# Patient Record
Sex: Female | Born: 1980 | Race: White | Hispanic: Yes | Marital: Single | Smoking: Never smoker
Health system: Southern US, Community
[De-identification: ages and names within clinical notes are randomized; demographics above are authoritative.]

## PROBLEM LIST (undated history)

## (undated) ENCOUNTER — Inpatient Hospital Stay (HOSPITAL_COMMUNITY): Payer: Self-pay

## (undated) DIAGNOSIS — N83209 Unspecified ovarian cyst, unspecified side: Secondary | ICD-10-CM

## (undated) DIAGNOSIS — R112 Nausea with vomiting, unspecified: Secondary | ICD-10-CM

## (undated) DIAGNOSIS — Z9889 Other specified postprocedural states: Secondary | ICD-10-CM

---
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
## 2009-11-11 ENCOUNTER — Emergency Department (HOSPITAL_COMMUNITY): Admission: EM | Admit: 2009-11-11 | Discharge: 2009-11-11 | Payer: Self-pay | Admitting: Emergency Medicine

## 2016-03-22 ENCOUNTER — Inpatient Hospital Stay (HOSPITAL_COMMUNITY)
Admission: AD | Admit: 2016-03-22 | Discharge: 2016-03-22 | Disposition: A | Discharge status: Home / Self Care | Payer: Self-pay | Source: Ambulatory Visit | Attending: Obstetrics and Gynecology | Admitting: Obstetrics and Gynecology

## 2016-03-22 ENCOUNTER — Inpatient Hospital Stay (HOSPITAL_COMMUNITY): Payer: Self-pay

## 2016-03-22 ENCOUNTER — Encounter (HOSPITAL_COMMUNITY): Payer: Self-pay | Admitting: Obstetrics and Gynecology

## 2016-03-22 DIAGNOSIS — N83201 Unspecified ovarian cyst, right side: Secondary | ICD-10-CM

## 2016-03-22 DIAGNOSIS — Z9889 Other specified postprocedural states: Secondary | ICD-10-CM | POA: Insufficient documentation

## 2016-03-22 DIAGNOSIS — O209 Hemorrhage in early pregnancy, unspecified: Secondary | ICD-10-CM

## 2016-03-22 DIAGNOSIS — O26851 Spotting complicating pregnancy, first trimester: Secondary | ICD-10-CM | POA: Insufficient documentation

## 2016-03-22 DIAGNOSIS — Z3A11 11 weeks gestation of pregnancy: Secondary | ICD-10-CM | POA: Insufficient documentation

## 2016-03-22 DIAGNOSIS — O3680X0 Pregnancy with inconclusive fetal viability, not applicable or unspecified: Secondary | ICD-10-CM

## 2016-03-22 DIAGNOSIS — O26891 Other specified pregnancy related conditions, first trimester: Secondary | ICD-10-CM | POA: Insufficient documentation

## 2016-03-22 DIAGNOSIS — R1032 Left lower quadrant pain: Secondary | ICD-10-CM | POA: Insufficient documentation

## 2016-03-22 DIAGNOSIS — Z3201 Encounter for pregnancy test, result positive: Secondary | ICD-10-CM | POA: Insufficient documentation

## 2016-03-22 LAB — CBC
HCT: 36.5 % (ref 36.0–46.0)
Hemoglobin: 12.6 g/dL (ref 12.0–15.0)
MCH: 29.5 pg (ref 26.0–34.0)
MCHC: 34.5 g/dL (ref 30.0–36.0)
MCV: 85.5 fL (ref 78.0–100.0)
Platelets: 211 10*3/uL (ref 150–400)
RBC: 4.27 MIL/uL (ref 3.87–5.11)
RDW: 13.4 % (ref 11.5–15.5)
WBC: 9.4 10*3/uL (ref 4.0–10.5)

## 2016-03-22 LAB — URINALYSIS, ROUTINE W REFLEX MICROSCOPIC
Bilirubin Urine: NEGATIVE
Glucose, UA: NEGATIVE mg/dL
Ketones, ur: 15 mg/dL — AB
NITRITE: NEGATIVE
PROTEIN: NEGATIVE mg/dL
pH: 6 (ref 5.0–8.0)

## 2016-03-22 LAB — WET PREP, GENITAL
Clue Cells Wet Prep HPF POC: NONE SEEN
Sperm: NONE SEEN
Trich, Wet Prep: NONE SEEN
Yeast Wet Prep HPF POC: NONE SEEN

## 2016-03-22 LAB — URINE MICROSCOPIC-ADD ON

## 2016-03-22 LAB — HCG, QUANTITATIVE, PREGNANCY: HCG, BETA CHAIN, QUANT, S: 7627 m[IU]/mL — AB (ref <5)

## 2016-03-22 LAB — POCT PREGNANCY, URINE: PREG TEST UR: POSITIVE — AB

## 2016-03-22 NOTE — MAU Note (Signed)
Pt states having left sided lower pelvic and lower back pain that started today at a 2/10.  Pt states that she started spotting today bright red.

## 2016-03-22 NOTE — MAU Provider Note (Signed)
CC:  Spotting in pregnancy  Subjective Sonia Johnson 35 y.o.  G2P1001 at [redacted]w[redacted]d by sure LMP presents with onset at 75 today of first episode of small amount orange-red vaginal spotting after wiping. Has had menstrual-like crampy lower abdominal pain intermittently since pregnancy diagnosed. Last intercourse months ago. Denies irritative vaginal discharge. No dysuria or hematuria.  Blood type: unknown  Pregnancy course: NPC. Has appointment to start care at [facility].  Pertinent Medical History: none Pertinent Ob/Gyn History: NSVD 11 years ago Pertinent Surgical History: ovarian cystectomy Pertinent Social History:[place] History reviewed. No pertinent past medical history. Past Surgical History  Procedure Laterality Date  . Ovarian cyst removal  2003   No prescriptions prior to admission   Social History   Social History  . Marital Status: Married    Spouse Name: N/A  . Number of Children: N/A  . Years of Education: N/A   Occupational History  . Not on file.   Social History Main Topics  . Smoking status: Never Smoker   . Smokeless tobacco: Never Used  . Alcohol Use: No  . Drug Use: No  . Sexual Activity: Not Currently   Other Topics Concern  . Not on file   Social History Narrative  . No narrative on file   Not on File  Objective   Filed Vitals:   03/22/16 1842  BP: 116/61  Pulse: 90  Temp: 97.6 F (36.4 C)  Resp: 18     Physical Exam General: Obese HF WN/WD in NAD  Abdom: soft, NT External genitalia: normal; BUS neg  SSE: small amount blood; cervix with no lesions, appears closed Bimanual: Cervix ext ft, int os closed, long; uterus  NT, 8-10 weeks size; adnexa nontender, no masses   Lab Results Results for orders placed or performed during the hospital encounter of 03/22/16 (from the past 24 hour(s))  Urinalysis, Routine w reflex microscopic (not at [facility])     Status: Abnormal   Collection Time: 03/22/16  6:25 PM  Result Value Ref Range   Color, Urine  YELLOW YELLOW   APPearance CLEAR CLEAR   Specific Gravity, Urine >1.030 (H) 1.005 - 1.030   pH 6.0 5.0 - 8.0   Glucose, UA NEGATIVE NEGATIVE mg/dL   Hgb urine dipstick SMALL (A) NEGATIVE   Bilirubin Urine NEGATIVE NEGATIVE   Ketones, ur 15 (A) NEGATIVE mg/dL   Protein, ur NEGATIVE NEGATIVE mg/dL   Nitrite NEGATIVE NEGATIVE   Leukocytes, UA SMALL (A) NEGATIVE  Urine microscopic-add on     Status: Abnormal   Collection Time: 03/22/16  6:25 PM  Result Value Ref Range   Squamous Epithelial / LPF 6-30 (A) NONE SEEN   WBC, UA 6-30 0 - 5 WBC/hpf   RBC / HPF 0-5 0 - 5 RBC/hpf   Bacteria, UA MANY (A) NONE SEEN   Urine-Other MUCOUS PRESENT   Pregnancy, urine POC     Status: Abnormal   Collection Time: 03/22/16  6:55 PM  Result Value Ref Range   Preg Test, Ur POSITIVE (A) NEGATIVE  CBC     Status: None   Collection Time: 03/22/16  7:15 PM  Result Value Ref Range   WBC 9.4 4.0 - 10.5 K/uL   RBC 4.27 3.87 - 5.11 MIL/uL   Hemoglobin 12.6 12.0 - 15.0 g/dL   HCT 36.5 36.0 - 46.0 %   MCV 85.5 78.0 - 100.0 fL   MCH 29.5 26.0 - 34.0 pg   MCHC 34.5 30.0 - 36.0 g/dL   RDW  13.4 11.5 - 15.5 %   Platelets 211 150 - 400 K/uL     Ref. Range 03/22/2016 19:15  HCG, Beta Chain, Quant, S Latest Ref Range: <5 mIU/mL 7627 (H)    Ultrasound US Ob Comp Less 14 Wks  03/22/2016  CLINICAL DATA:  Left lower quadrant abdominal pain, vaginal spotting, first trimester of pregnancy. EXAM: OBSTETRIC <14 WK [place] AND TRANSVAGINAL OB US TECHNIQUE: Both transabdominal and transvaginal ultrasound examinations were performed for complete evaluation of the gestation as well as the maternal uterus, adnexal regions, and pelvic cul-de-sac. Transvaginal technique was performed to assess early pregnancy. COMPARISON:  None. FINDINGS: Intrauterine gestational sac: Possible intrauterine gestational sac is noted. Yolk sac:  Not visualized. Embryo:  Not visualized. Cardiac Activity: Not visualized. MSD: 17  mm   6 w   4  d [place] EDC:  November 11, 2016. Subchorionic hemorrhage:  None visualized. Maternal uterus/adnexae: Normal left ovary. Trace free fluid is noted. 1.8 cm simple right ovarian cyst is noted consistent with corpus luteum. Complex cystic mass measuring 5.8 x 4.2 x 4.2 cm is noted in right adnexal region. 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 17 mm intrauterine fluid collection is noted. Findings are suspicious but not yet definitive for failed pregnancy. Recommend follow-up US in 10-14 days for definitive diagnosis. This recommendation follows SRU consensus guidelines: Diagnostic Criteria for Nonviable Pregnancy Early in the First Trimester. [place] Med [id number]. Electronically Signed   By: Marijo Conception, M.D.   On: 03/22/2016 19:59   US Ob Transvaginal  03/22/2016  CLINICAL DATA:  Left lower quadrant abdominal pain, vaginal spotting, first trimester of pregnancy. EXAM: OBSTETRIC <14 WK [place] AND TRANSVAGINAL OB US TECHNIQUE: Both transabdominal and transvaginal ultrasound examinations were performed for complete evaluation of the gestation as well as the maternal uterus, adnexal regions, and pelvic cul-de-sac. Transvaginal technique was performed to assess early pregnancy. COMPARISON:  None. FINDINGS: Intrauterine gestational sac: Possible intrauterine gestational sac is noted. Yolk sac:  Not visualized. Embryo:  Not visualized. Cardiac Activity: Not visualized. MSD: 17  mm   6 w   4  d [place] EDC: November 11, 2016. Subchorionic hemorrhage:  None visualized. Maternal uterus/adnexae: Normal left ovary. Trace free fluid is noted. 1.8 cm simple right ovarian cyst is noted consistent with corpus luteum. Complex cystic mass measuring 5.8 x 4.2 x 4.2 cm is noted in right adnexal region. 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 17 mm intrauterine fluid collection is noted.  Findings are suspicious but not yet definitive for failed pregnancy. Recommend follow-up US in 10-14 days for definitive diagnosis. This recommendation follows SRU consensus guidelines: Diagnostic Criteria for Nonviable Pregnancy Early in the First Trimester. [employer] Med [id number]. Electronically Signed   By: Marijo Conception, M.D.   On: 03/22/2016 19:59    No results found. Care assumed by Hansel Feinstein CNM at 2010  Reviewed results, including Quant HCG Reviewed US findings Discussed we cannot be sure of viability of pregnancy. Could be early pregnancy or failed pregnancy Also discussed right ovarian complex cyst.  Discussed will need to be removed at some point. She states she had a large 7cm cyst drained a few years ago.  Very worried, they have been trying for 11 years   This bleeding/pain can represent a normal pregnancy with bleeding, spontaneous abortion or even  an ectopic which can be life-threatening.  The process as listed above helps to determine which of these is present.   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

## 2016-03-22 NOTE — Discharge Instructions (Signed)
Ovarian Cyst An ovarian cyst is a fluid-filled sac that forms on an ovary. The ovaries are small organs that produce eggs in women. Various types of cysts can form on the ovaries. Most are not cancerous. Many do not cause problems, and they often go away on their own. Some may cause symptoms and require treatment. Common types of ovarian cysts include:  Functional cysts--These cysts may occur every month during the menstrual cycle. This is normal. The cysts usually go away with the next menstrual cycle if the woman does not get pregnant. Usually, there are no symptoms with a functional cyst.  Endometrioma cysts--These cysts form from the tissue that lines the uterus. They are also called "chocolate cysts" because they become filled with blood that turns brown. This type of cyst can cause pain in the lower abdomen during intercourse and with your menstrual period.  Cystadenoma cysts--This type develops from the cells on the outside of the ovary. These cysts can get very big and cause lower abdomen pain and pain with intercourse. This type of cyst can twist on itself, cut off its blood supply, and cause severe pain. It can also easily rupture and cause a lot of pain.  Dermoid cysts--This type of cyst is sometimes found in both ovaries. These cysts may contain different kinds of body tissue, such as skin, teeth, hair, or cartilage. They usually do not cause symptoms unless they get very big.  Theca lutein cysts--These cysts occur when too much of a certain hormone (human chorionic gonadotropin) is produced and overstimulates the ovaries to produce an egg. This is most common after procedures used to assist with the conception of a baby (in vitro fertilization). CAUSES   Fertility drugs can cause a condition in which multiple large cysts are formed on the ovaries. This is called ovarian hyperstimulation syndrome.  A condition called polycystic ovary syndrome can cause hormonal imbalances that can lead to  nonfunctional ovarian cysts. SIGNS AND SYMPTOMS  Many ovarian cysts do not cause symptoms. If symptoms are present, they may include:  Pelvic pain or pressure.  Pain in the lower abdomen.  Pain during sexual intercourse.  Increasing girth (swelling) of the abdomen.  Abnormal menstrual periods.  Increasing pain with menstrual periods.  Stopping having menstrual periods without being pregnant. DIAGNOSIS  These cysts are commonly found during a routine or annual pelvic exam. Tests may be ordered to find out more about the cyst. These tests may include:  Ultrasound.  X-ray of the pelvis.  CT scan.  MRI.  Blood tests. TREATMENT  Many ovarian cysts go away on their own without treatment. Your health care provider may want to check your cyst regularly for 2-3 months to see if it changes. For women in menopause, it is particularly important to monitor a cyst closely because of the higher rate of ovarian cancer in menopausal women. When treatment is needed, it may include any of the following:  A procedure to drain the cyst (aspiration). This may be done using a long needle and ultrasound. It can also be done through a laparoscopic procedure. This involves using a thin, lighted tube with a tiny camera on the end (laparoscope) inserted through a small incision.  Surgery to remove the whole cyst. This may be done using laparoscopic surgery or an open surgery involving a larger incision in the lower abdomen.  Hormone treatment or birth control pills. These methods are sometimes used to help dissolve a cyst. HOME CARE INSTRUCTIONS   Only take over-the-counter  or prescription medicines as directed by your health care provider.  Follow up with your health care provider as directed.  Get regular pelvic exams and Pap tests. SEEK MEDICAL CARE IF:   Your periods are late, irregular, or painful, or they stop.  Your pelvic pain or abdominal pain does not go away.  Your abdomen becomes  larger or swollen.  You have pressure on your bladder or trouble emptying your bladder completely.  You have pain during sexual intercourse.  You have feelings of fullness, pressure, or discomfort in your stomach.  You lose weight for no apparent reason.  You feel generally ill.  You become constipated.  You lose your appetite.  You develop acne.  You have an increase in body and facial hair.  You are gaining weight, without changing your exercise and eating habits.  You think you are pregnant. SEEK IMMEDIATE MEDICAL CARE IF:   You have increasing abdominal pain.  You feel sick to your stomach (nauseous), and you throw up (vomit).  You develop a fever that comes on suddenly.  You have abdominal pain during a bowel movement.  Your menstrual periods become heavier than usual. MAKE SURE YOU:  Understand these instructions.  Will watch your condition.  Will get help right away if you are not doing well or get worse.   This information is not intended to replace advice given to you by your health care provider. Make sure you discuss any questions you have with your health care provider.   Document Released: 11/27/2005 Document Revised: 12/02/2013 Document Reviewed: 08/04/2013 Elsevier Interactive Patient Education 2016 Elsevier Inc. Vaginal Bleeding During Pregnancy, First Trimester A small amount of bleeding (spotting) from the vagina is relatively common in early pregnancy. It usually stops on its own. Various things may cause bleeding or spotting in early pregnancy. Some bleeding may be related to the pregnancy, and some may not. In most cases, the bleeding is normal and is not a problem. However, bleeding can also be a sign of something serious. Be sure to tell your health care provider about any vaginal bleeding right away. Some possible causes of vaginal bleeding during the first trimester include:  Infection or inflammation of the cervix.  Growths (polyps) on  the cervix.  Miscarriage or threatened miscarriage.  Pregnancy tissue has developed outside of the uterus and in a fallopian tube (tubal pregnancy).  Tiny cysts have developed in the uterus instead of pregnancy tissue (molar pregnancy). HOME CARE INSTRUCTIONS  Watch your condition for any changes. The following actions may help to lessen any discomfort you are feeling:  Follow your health care provider's instructions for limiting your activity. If your health care provider orders bed rest, you may need to stay in bed and only get up to use the bathroom. However, your health care provider may allow you to continue light activity.  If needed, make plans for someone to help with your regular activities and responsibilities while you are on bed rest.  Keep track of the number of pads you use each day, how often you change pads, and how soaked (saturated) they are. Write this down.  Do not use tampons. Do not douche.  Do not have sexual intercourse or orgasms until approved by your health care provider.  If you pass any tissue from your vagina, save the tissue so you can show it to your health care provider.  Only take over-the-counter or prescription medicines as directed by your health care provider.  Do not take aspirin  because it can make you bleed.  Keep all follow-up appointments as directed by your health care provider. SEEK MEDICAL CARE IF:  You have any vaginal bleeding during any part of your pregnancy.  You have cramps or labor pains.  You have a fever, not controlled by medicine. SEEK IMMEDIATE MEDICAL CARE IF:   You have severe cramps in your back or belly (abdomen).  You pass large clots or tissue from your vagina.  Your bleeding increases.  You feel light-headed or weak, or you have fainting episodes.  You have chills.  You are leaking fluid or have a gush of fluid from your vagina.  You pass out while having a bowel movement. MAKE SURE YOU:  Understand  these instructions.  Will watch your condition.  Will get help right away if you are not doing well or get worse.   This information is not intended to replace advice given to you by your health care provider. Make sure you discuss any questions you have with your health care provider.   Document Released: 09/06/2005 Document Revised: 12/02/2013 Document Reviewed: 08/04/2013 Elsevier Interactive Patient Education [employer].

## 2016-03-23 ENCOUNTER — Telehealth (HOSPITAL_COMMUNITY): Payer: Self-pay | Admitting: *Deleted

## 2016-03-23 DIAGNOSIS — A749 Chlamydial infection, unspecified: Secondary | ICD-10-CM

## 2016-03-23 DIAGNOSIS — O98811 Other maternal infectious and parasitic diseases complicating pregnancy, first trimester: Principal | ICD-10-CM

## 2016-03-23 LAB — HIV ANTIBODY (ROUTINE TESTING W REFLEX): HIV SCREEN 4TH GENERATION: NONREACTIVE

## 2016-03-23 LAB — GC/CHLAMYDIA PROBE AMP (~~LOC~~) NOT AT ARMC
Chlamydia: POSITIVE — AB
NEISSERIA GONORRHEA: NEGATIVE

## 2016-03-23 LAB — ABO/RH: ABO/RH(D): A POS

## 2016-03-23 MED ORDER — AZITHROMYCIN 500 MG PO TABS: ORAL_TABLET | ORAL | Status: DC

## 2016-03-23 NOTE — Telephone Encounter (Signed)
Telephone call to patient regarding positive chlamydia culture, patient notified.  Rx routed to pharmacy with one refill for partner treatment per protocol .  Partner drug allergies verified. .  Instructed patient to notify her partner for treatment and to  abstain from sex for seven days.  Report faxed to health department.

## 2016-03-24 ENCOUNTER — Inpatient Hospital Stay (HOSPITAL_COMMUNITY)
Admission: AD | Admit: 2016-03-24 | Discharge: 2016-03-24 | Disposition: A | Discharge status: Home / Self Care | Payer: Self-pay | Source: Ambulatory Visit | Attending: Obstetrics & Gynecology | Admitting: Obstetrics & Gynecology

## 2016-03-24 DIAGNOSIS — O021 Missed abortion: Secondary | ICD-10-CM | POA: Insufficient documentation

## 2016-03-24 DIAGNOSIS — Z3A11 11 weeks gestation of pregnancy: Secondary | ICD-10-CM | POA: Insufficient documentation

## 2016-03-24 LAB — HCG, QUANTITATIVE, PREGNANCY: hCG, Beta Chain, Quant, S: 5592 m[IU]/mL — ABNORMAL HIGH (ref <5)

## 2016-03-24 MED ORDER — MISOPROSTOL 200 MCG PO TABS: ORAL_TABLET | ORAL | Status: DC

## 2016-03-24 MED ORDER — PROMETHAZINE HCL 12.5 MG PO TABS: 12.5000 mg | ORAL_TABLET | Freq: Four times a day (QID) | ORAL | Status: DC | PRN

## 2016-03-24 MED ORDER — IBUPROFEN 600 MG PO TABS: 600.0000 mg | ORAL_TABLET | Freq: Four times a day (QID) | ORAL | Status: DC | PRN

## 2016-03-24 MED ORDER — OXYCODONE-ACETAMINOPHEN 5-325 MG PO TABS: 1.0000 | ORAL_TABLET | Freq: Four times a day (QID) | ORAL | Status: DC | PRN

## 2016-03-24 NOTE — MAU Note (Signed)
Patient here for repeat BHCG, denies vaginal bleeding or pain today.

## 2016-03-24 NOTE — Discharge Instructions (Signed)
Aborto incompleto °(Incomplete Miscarriage) °Un aborto espontáneo es la pérdida repentina de un bebé en gestación (feto) antes de la semana 20 del embarazo. En un aborto espontáneo, partes del feto o la placenta (alumbramiento) permanecen en el cuerpo.  °El aborto espontáneo puede ser una experiencia que afecte emocionalmente a la persona. Hable con su médico si tiene preguntas sobre el aborto espontáneo, el proceso de duelo y los planes futuros de embarazo. °CAUSAS  °· Algunos problemas cromosómicos pueden hacer imposible que el bebé se desarrolle normalmente. Los problemas con los genes o cromosomas del bebé son, en la mayoría de los casos, el resultado de errores que se producen, al azar, cuando el embrión se divide y crece. Estos problemas no se heredan de los padres. °· Infección en el cuello del útero. °· Problemas hormonales. °· Problemas en el cuello del útero, como tener un útero incompetente. Esto ocurre cuando los tejidos no son lo suficientemente fuertes como para contener el embarazo. °· Problemas del útero, como un útero con forma anormal, los fibromas o anormalidades congénitas. °· Ciertas enfermedades crónicas. °· No fume, no beba alcohol, ni consuma drogas. °· Traumatismos. °SÍNTOMAS  °· Sangrado o manchado vaginal, con o sin cólicos o dolor. °· Dolor o cólicos en el abdomen o en la cintura. °· Eliminación de líquido, tejidos o coágulos grandes por la vagina. °DIAGNÓSTICO  °El médico le hará un examen físico. También le indicará una ecografía para confirmar el aborto. Es posible que se realicen análisis de sangre. °TRATAMIENTO  °· Generalmente se realiza un procedimiento de dilatación y curetaje (D y C). Durante el procedimiento de dilatación y curetaje, el cuello del útero se abre (dilata) y se retira todo resto de tejido fetal o placentario del útero. °· Si hay una infección, le recetarán antibióticos. Posiblemente le receten otros medicamentos para reducir (contraer) el tamaño del útero si hay  mucha hemorragia. °· Si su tipo de sangre es Rh negativo y el del bebé es Rh positivo, necesitará una inyección de inmunoglobulina Rho(D). Esta inyección protegerá a los futuros bebés de tener problemas de compatibilidad Rh en futuros embarazos. °· Probablemente le indiquen reposo. Esto significa que debe quedarse en cama y levantarse únicamente para ir al baño. °INSTRUCCIONES PARA EL CUIDADO EN EL HOGAR  °· Haga reposo según las indicaciones del médico. °· Limite las actividades según las indicaciones del médico. Es posible que se le permita retomar las actividades livianas si no se le realizó un curetaje, pero necesitará tratamiento adicional. °· Lleve un registro de la cantidad de toallas sanitarias que usa por día. Observe cuán impregnadas (saturadas) están. Registre esta información. °· No  use tampones. °· No se haga duchas vaginales ni tenga relaciones sexuales hasta que el médico la autorice. °· Asista a todas las citas de seguimiento para una nueva evaluación y para continuar el tratamiento. °· Sólo tome medicamentos de venta libre o recetados para calmar el dolor, el malestar o bajar la fiebre, según las indicaciones de su médico. °· Tome los antibióticos como le indicó el médico. Asegúrese de que finaliza la prescripción completa aunque se sienta mejor. °SOLICITE ATENCIÓN MÉDICA DE INMEDIATO SI:  °· Siente calambres intensos en el estómago, en la espalda o en el abdomen. °· Le sube la fiebre sin motivo (asegúrese de registrar las cifras). °· Elimina coágulos grandes o tejidos (consérvelos para que el médico los analice). °· La hemorragia aumenta. °· Se siente mareada, débil o tiene episodios de desmayo. °ASEGÚRESE DE QUE:  °· Comprende estas instrucciones. °·   Controlar su afeccin.  Recibir ayuda de inmediato si no mejora o si empeora.   Esta informacin no tiene como fin reemplazar el consejo del mdico. Asegrese de hacerle al mdico cualquier pregunta que tenga.   Document Released: 11/27/2005  Document Revised: 09/17/2013 Elsevier Interactive Patient Education 2016 Elsevier Inc.  

## 2016-03-24 NOTE — MAU Provider Note (Signed)
Ms. Sonia Johnson  is a 35 y.o. G2P1001 at [redacted]w[redacted]d who presents to MAU today for follow-up quant hCG after 48 hours. She denies abdominal pain, bleeding or fever today. 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 Quant hCG was 7627 at that time.   BP 121/67 mmHg  Pulse 75  Temp(Src) 98.2 F (36.8 C) (Oral)  Resp 18  LMP 01/01/2016  CONSTITUTIONAL: Well-developed, well-nourished female in no acute distress.  ENT: External right and left ear normal.  EYES: EOM intact, conjunctivae normal.  MUSCULOSKELETAL: Normal range of motion.  CARDIOVASCULAR: Regular heart rate RESPIRATORY: Normal effort NEUROLOGICAL: Alert and oriented to person, place, and time.  SKIN: Skin is warm and dry. No rash noted. Not diaphoretic. No erythema. No pallor. PSYCH: Normal mood and affect. Normal behavior. Normal judgment and thought content.  Results for NAQUISHA, HOFFMAN (MRN [id number]) as of 03/24/2016 18:01  Ref. Range 03/22/2016 19:15 03/22/2016 19:20 03/22/2016 19:49 03/24/2016 16:30  HCG, Beta Chain, Quant, S Latest Ref Range: <5 mIU/mL 7627 (H)   5592 (H)   MDM Discussed patient with Dr. Ihor Dow. Agrees with plan for Cytotec today given significant decline in hcg. Recommends Cytotec 800 mcg today and repeat in 12 hours if no bleeding.  Early Intrauterine Pregnancy Failure  _x__  Documented intrauterine pregnancy failure less than or equal to [redacted] weeks gestation  _x__  No serious current illness  _x__  Baseline Hgb greater than or equal to 10g/dl  _x__  Patient has easily accessible transportation to the hospital  __x_  Clear preference  _x__  Practitioner/physician deems patient reliable  _x__  Counseling by practitioner or physician  _x__  Patient education by RN  _N/A__  Consent form signed  __N/A_  Rho-Gam given by RN if indicated  _x__ Medication dispensed   _x__   Cytotec 800 mcg  _x_   Intravaginally by patient at home         __    Intravaginally by RN in MAU        __   Rectally by patient at home        __   Rectally by RN in MAU  _x__  Ibuprofen 600 mg 1 tablet by mouth every 6 hours as needed #30  _x__  Hydrocodone/acetaminophen 5/325 mg by mouth every 4 to 6 hours as needed  _x__  Phenergan 12.5 mg by mouth every 4 hours as needed for nausea  Comfort care package given. Patient is sad.   A:  Missed AB Right cystic teratoma   P: Discharge home Rx for Cytotec, Phenergan, Percocet and Ibuprofen given Bleeding precautions discussed Patient referred to [facility] for follow-up in ~ 2 weeks for SAB and MD consult for teratoma [place] cancelled for 03/29/16 Patient may return to MAU as needed or if her condition were to change or worsen   Luvenia Redden, PA-C 03/24/2016 6:00 PM

## 2016-03-29 ENCOUNTER — Ambulatory Visit (HOSPITAL_COMMUNITY): Payer: Self-pay

## 2016-04-04 ENCOUNTER — Telehealth: Payer: Self-pay | Admitting: Obstetrics and Gynecology

## 2016-04-04 ENCOUNTER — Encounter (HOSPITAL_COMMUNITY): Payer: Self-pay | Admitting: Obstetrics and Gynecology

## 2016-04-04 NOTE — Telephone Encounter (Signed)
TC to pt:  1. Passed POC after cytotec, no longer bleeding.  2. She and partner got STI tx for her +CT. 3. Has appt [facility] for F/U rt ovarian cystic teratoma  Lorene Dy, CNM 04/04/2016 8:46 AM

## 2016-04-09 ENCOUNTER — Encounter (HOSPITAL_COMMUNITY): Payer: Self-pay | Admitting: Obstetrics and Gynecology

## 2016-04-24 ENCOUNTER — Encounter: Payer: Self-pay | Admitting: Obstetrics & Gynecology

## 2016-04-24 ENCOUNTER — Ambulatory Visit (INDEPENDENT_AMBULATORY_CARE_PROVIDER_SITE_OTHER): Payer: Self-pay | Admitting: Obstetrics & Gynecology

## 2016-04-24 VITALS — BP 106/56 | HR 72 | Wt 173.0 lb

## 2016-04-24 DIAGNOSIS — D279 Benign neoplasm of unspecified ovary: Secondary | ICD-10-CM | POA: Insufficient documentation

## 2016-04-24 DIAGNOSIS — N83291 Other ovarian cyst, right side: Secondary | ICD-10-CM

## 2016-04-24 DIAGNOSIS — N83209 Unspecified ovarian cyst, unspecified side: Secondary | ICD-10-CM

## 2016-04-24 NOTE — Progress Notes (Signed)
Patient ID: Sonia Johnson, female   DOB: 1981/07/29, 35 y.o.   MRN: [id number]  Chief Complaint  Patient presents with  . Follow-up    Cyst     HPI Sonia Johnson is a 35 y.o. female.  G2P1001 Patient's last menstrual period was 01/01/2016. Seen in ED 4/14 and DX with blighted ovum, treated with cytotec and did well. Needs f/u for right ovarian cyst  HPI  No past medical history on file.  Past Surgical History  Procedure Laterality Date  . Ovarian cyst removal  2003  [place]   No family history on file.  Social History Social History  Substance Use Topics  . Smoking status: Never Smoker   . Smokeless tobacco: Never Used  . Alcohol Use: No    No Known Allergies  Current Outpatient Prescriptions  Medication Sig Dispense Refill  . azithromycin (ZITHROMAX) 500 MG tablet Take two tablets by mouth once. (Patient not taking: Reported on 04/24/2016) 2 tablet 1  . ibuprofen (ADVIL,MOTRIN) 600 MG tablet Take 1 tablet (600 mg total) by mouth every 6 (six) hours as needed. (Patient not taking: Reported on 04/24/2016) 30 tablet 0  . misoprostol (CYTOTEC) 200 MCG tablet Place 4 tabs (800 mcg) vaginally once, repeat in 12-24 hours if no bleeding (Patient not taking: Reported on 04/24/2016) 4 tablet 1  . oxyCODONE-acetaminophen (PERCOCET/ROXICET) 5-325 MG tablet Take 1 tablet by mouth every 6 (six) hours as needed for severe pain. (Patient not taking: Reported on 04/24/2016) 12 tablet 0  . Prenatal Vit-Fe Fumarate-FA (PRENATAL MULTIVITAMIN) TABS tablet Take 1 tablet by mouth daily at 12 noon. Reported on 04/24/2016    . promethazine (PHENERGAN) 12.5 MG tablet Take 1 tablet (12.5 mg total) by mouth every 6 (six) hours as needed for nausea or vomiting. (Patient not taking: Reported on 04/24/2016) 30 tablet 0   No current facility-administered medications for this visit.    Review of Systems Review of Systems  Constitutional: Negative.   Genitourinary: Negative for vaginal bleeding, menstrual  problem and pelvic pain.    Blood pressure 106/56, pulse 72, weight 173 lb (78.472 kg), last menstrual period 01/01/2016, unknown if currently breastfeeding.  Physical Exam Physical Exam  Constitutional: She is oriented to person, place, and time. She appears well-developed. No distress.  Cardiovascular: Normal rate.   Pulmonary/Chest: Effort normal.  Neurological: She is alert and oriented to person, place, and time.  Psychiatric: She has a normal mood and affect. Her behavior is normal.    Data Reviewed CLINICAL DATA: Left lower quadrant abdominal pain, vaginal spotting, first trimester of pregnancy.  EXAM: OBSTETRIC <14 WK [place] AND TRANSVAGINAL OB US  TECHNIQUE: Both transabdominal and transvaginal ultrasound examinations were performed for complete evaluation of the gestation as well as the maternal uterus, adnexal regions, and pelvic cul-de-sac. Transvaginal technique was performed to assess early pregnancy.  COMPARISON: None.  FINDINGS: Intrauterine gestational sac: Possible intrauterine gestational sac is noted.  Yolk sac: Not visualized.  Embryo: Not visualized.  Cardiac Activity: Not visualized.  MSD: 17 mm 6 w 4 d  [place] EDC: November 11, 2016.  Subchorionic hemorrhage: None visualized.  Maternal uterus/adnexae: Normal left ovary. Trace free fluid is noted. 1.8 cm simple right ovarian cyst is noted consistent with corpus luteum. Complex cystic mass measuring 5.8 x 4.2 x 4.2 cm is noted in right adnexal region. 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  17 mm intrauterine fluid collection is noted. Findings  are suspicious but not yet definitive for failed pregnancy. Recommend follow-up US in 10-14 days for definitive diagnosis. This recommendation follows SRU consensus guidelines: Diagnostic Criteria for Nonviable Pregnancy Early in the First Trimester. [employer] Med [id number]:048977.   Electronically Signed  By: Marijo Conception, M.D.  On: 03/22/2016 19:59        Assessment    S/P early Ab and doing well, will use condom for [facility] Right ovarian cyst complex, needs f/u in 2-3 weeks     Plan    F/u US scheduled, f/u visit to be set up        [place] 04/24/2016, 1:25 PM

## 2016-04-24 NOTE — Patient Instructions (Signed)
Ovarian Cyst An ovarian cyst is a fluid-filled sac that forms on an ovary. The ovaries are small organs that produce eggs in women. Various types of cysts can form on the ovaries. Most are not cancerous. Many do not cause problems, and they often go away on their own. Some may cause symptoms and require treatment. Common types of ovarian cysts include:  Functional cysts--These cysts may occur every month during the menstrual cycle. This is normal. The cysts usually go away with the next menstrual cycle if the woman does not get pregnant. Usually, there are no symptoms with a functional cyst.  Endometrioma cysts--These cysts form from the tissue that lines the uterus. They are also called "chocolate cysts" because they become filled with blood that turns brown. This type of cyst can cause pain in the lower abdomen during intercourse and with your menstrual period.  Cystadenoma cysts--This type develops from the cells on the outside of the ovary. These cysts can get very big and cause lower abdomen pain and pain with intercourse. This type of cyst can twist on itself, cut off its blood supply, and cause severe pain. It can also easily rupture and cause a lot of pain.  Dermoid cysts--This type of cyst is sometimes found in both ovaries. These cysts may contain different kinds of body tissue, such as skin, teeth, hair, or cartilage. They usually do not cause symptoms unless they get very big.  Theca lutein cysts--These cysts occur when too much of a certain hormone (human chorionic gonadotropin) is produced and overstimulates the ovaries to produce an egg. This is most common after procedures used to assist with the conception of a baby (in vitro fertilization). CAUSES   Fertility drugs can cause a condition in which multiple large cysts are formed on the ovaries. This is called ovarian hyperstimulation syndrome.  A condition called polycystic ovary syndrome can cause hormonal imbalances that can lead to  nonfunctional ovarian cysts. SIGNS AND SYMPTOMS  Many ovarian cysts do not cause symptoms. If symptoms are present, they may include:  Pelvic pain or pressure.  Pain in the lower abdomen.  Pain during sexual intercourse.  Increasing girth (swelling) of the abdomen.  Abnormal menstrual periods.  Increasing pain with menstrual periods.  Stopping having menstrual periods without being pregnant. DIAGNOSIS  These cysts are commonly found during a routine or annual pelvic exam. Tests may be ordered to find out more about the cyst. These tests may include:  Ultrasound.  X-ray of the pelvis.  CT scan.  MRI.  Blood tests. TREATMENT  Many ovarian cysts go away on their own without treatment. Your health care provider may want to check your cyst regularly for 2-3 months to see if it changes. For women in menopause, it is particularly important to monitor a cyst closely because of the higher rate of ovarian cancer in menopausal women. When treatment is needed, it may include any of the following:  A procedure to drain the cyst (aspiration). This may be done using a long needle and ultrasound. It can also be done through a laparoscopic procedure. This involves using a thin, lighted tube with a tiny camera on the end (laparoscope) inserted through a small incision.  Surgery to remove the whole cyst. This may be done using laparoscopic surgery or an open surgery involving a larger incision in the lower abdomen.  Hormone treatment or birth control pills. These methods are sometimes used to help dissolve a cyst. HOME CARE INSTRUCTIONS   Only take over-the-counter   or prescription medicines as directed by your health care provider.  Follow up with your health care provider as directed.  Get regular pelvic exams and Pap tests. SEEK MEDICAL CARE IF:   Your periods are late, irregular, or painful, or they stop.  Your pelvic pain or abdominal pain does not go away.  Your abdomen becomes  larger or swollen.  You have pressure on your bladder or trouble emptying your bladder completely.  You have pain during sexual intercourse.  You have feelings of fullness, pressure, or discomfort in your stomach.  You lose weight for no apparent reason.  You feel generally ill.  You become constipated.  You lose your appetite.  You develop acne.  You have an increase in body and facial hair.  You are gaining weight, without changing your exercise and eating habits.  You think you are pregnant. SEEK IMMEDIATE MEDICAL CARE IF:   You have increasing abdominal pain.  You feel sick to your stomach (nauseous), and you throw up (vomit).  You develop a fever that comes on suddenly.  You have abdominal pain during a bowel movement.  Your menstrual periods become heavier than usual. MAKE SURE YOU:  Understand these instructions.  Will watch your condition.  Will get help right away if you are not doing well or get worse.   This information is not intended to replace advice given to you by your health care provider. Make sure you discuss any questions you have with your health care provider.   Document Released: 11/27/2005 Document Revised: 12/02/2013 Document Reviewed: 08/04/2013 Elsevier Interactive Patient Education 2016 Elsevier Inc.  

## 2016-05-12 ENCOUNTER — Ambulatory Visit (HOSPITAL_COMMUNITY)
Admission: RE | Admit: 2016-05-12 | Discharge: 2016-05-12 | Disposition: A | Discharge status: Home / Self Care | Payer: Self-pay | Source: Ambulatory Visit | Attending: Obstetrics & Gynecology | Admitting: Obstetrics & Gynecology

## 2016-05-12 DIAGNOSIS — N83201 Unspecified ovarian cyst, right side: Secondary | ICD-10-CM | POA: Insufficient documentation

## 2016-05-12 DIAGNOSIS — N83209 Unspecified ovarian cyst, unspecified side: Secondary | ICD-10-CM

## 2016-05-22 ENCOUNTER — Encounter: Payer: Self-pay | Admitting: Obstetrics & Gynecology

## 2016-05-22 ENCOUNTER — Ambulatory Visit (INDEPENDENT_AMBULATORY_CARE_PROVIDER_SITE_OTHER): Payer: Self-pay | Admitting: Obstetrics & Gynecology

## 2016-05-22 VITALS — BP 109/65 | HR 89 | Wt 171.7 lb

## 2016-05-22 DIAGNOSIS — D279 Benign neoplasm of unspecified ovary: Secondary | ICD-10-CM

## 2016-05-22 NOTE — Patient Instructions (Signed)
Ovarian Cyst An ovarian cyst is a fluid-filled sac that forms on an ovary. The ovaries are small organs that produce eggs in women. Various types of cysts can form on the ovaries. Most are not cancerous. Many do not cause problems, and they often go away on their own. Some may cause symptoms and require treatment. Common types of ovarian cysts include:  Functional cysts--These cysts may occur every month during the menstrual cycle. This is normal. The cysts usually go away with the next menstrual cycle if the woman does not get pregnant. Usually, there are no symptoms with a functional cyst.  Endometrioma cysts--These cysts form from the tissue that lines the uterus. They are also called "chocolate cysts" because they become filled with blood that turns brown. This type of cyst can cause pain in the lower abdomen during intercourse and with your menstrual period.  Cystadenoma cysts--This type develops from the cells on the outside of the ovary. These cysts can get very big and cause lower abdomen pain and pain with intercourse. This type of cyst can twist on itself, cut off its blood supply, and cause severe pain. It can also easily rupture and cause a lot of pain.  Dermoid cysts--This type of cyst is sometimes found in both ovaries. These cysts may contain different kinds of body tissue, such as skin, teeth, hair, or cartilage. They usually do not cause symptoms unless they get very big.  Theca lutein cysts--These cysts occur when too much of a certain hormone (human chorionic gonadotropin) is produced and overstimulates the ovaries to produce an egg. This is most common after procedures used to assist with the conception of a baby (in vitro fertilization). CAUSES   Fertility drugs can cause a condition in which multiple large cysts are formed on the ovaries. This is called ovarian hyperstimulation syndrome.  A condition called polycystic ovary syndrome can cause hormonal imbalances that can lead to  nonfunctional ovarian cysts. SIGNS AND SYMPTOMS  Many ovarian cysts do not cause symptoms. If symptoms are present, they may include:  Pelvic pain or pressure.  Pain in the lower abdomen.  Pain during sexual intercourse.  Increasing girth (swelling) of the abdomen.  Abnormal menstrual periods.  Increasing pain with menstrual periods.  Stopping having menstrual periods without being pregnant. DIAGNOSIS  These cysts are commonly found during a routine or annual pelvic exam. Tests may be ordered to find out more about the cyst. These tests may include:  Ultrasound.  X-ray of the pelvis.  CT scan.  MRI.  Blood tests. TREATMENT  Many ovarian cysts go away on their own without treatment. Your health care provider may want to check your cyst regularly for 2-3 months to see if it changes. For women in menopause, it is particularly important to monitor a cyst closely because of the higher rate of ovarian cancer in menopausal women. When treatment is needed, it may include any of the following:  A procedure to drain the cyst (aspiration). This may be done using a long needle and ultrasound. It can also be done through a laparoscopic procedure. This involves using a thin, lighted tube with a tiny camera on the end (laparoscope) inserted through a small incision.  Surgery to remove the whole cyst. This may be done using laparoscopic surgery or an open surgery involving a larger incision in the lower abdomen.  Hormone treatment or birth control pills. These methods are sometimes used to help dissolve a cyst. HOME CARE INSTRUCTIONS   Only take over-the-counter   or prescription medicines as directed by your health care provider.  Follow up with your health care provider as directed.  Get regular pelvic exams and Pap tests. SEEK MEDICAL CARE IF:   Your periods are late, irregular, or painful, or they stop.  Your pelvic pain or abdominal pain does not go away.  Your abdomen becomes  larger or swollen.  You have pressure on your bladder or trouble emptying your bladder completely.  You have pain during sexual intercourse.  You have feelings of fullness, pressure, or discomfort in your stomach.  You lose weight for no apparent reason.  You feel generally ill.  You become constipated.  You lose your appetite.  You develop acne.  You have an increase in body and facial hair.  You are gaining weight, without changing your exercise and eating habits.  You think you are pregnant. SEEK IMMEDIATE MEDICAL CARE IF:   You have increasing abdominal pain.  You feel sick to your stomach (nauseous), and you throw up (vomit).  You develop a fever that comes on suddenly.  You have abdominal pain during a bowel movement.  Your menstrual periods become heavier than usual. MAKE SURE YOU:  Understand these instructions.  Will watch your condition.  Will get help right away if you are not doing well or get worse.   This information is not intended to replace advice given to you by your health care provider. Make sure you discuss any questions you have with your health care provider.   Document Released: 11/27/2005 Document Revised: 12/02/2013 Document Reviewed: 08/04/2013 Elsevier Interactive Patient Education 2016 Elsevier Inc.  

## 2016-05-22 NOTE — Progress Notes (Signed)
Patient ID: Sonia Johnson, female   DOB: 1981/08/11, 35 y.o.   MRN: [id number]  Chief Complaint  Patient presents with  . surgical consult  ovarian dermoid cyst  HPI Sonia Johnson is a 35 y.o. female.  G2P1001 Patient's last menstrual period was 04/26/2016. [place] on 6/2 confirms no change of ovarian dermoid and needs surgical removal. No pain or menstrual problem  HPI  No past medical history on file.  Past Surgical History  Procedure Laterality Date  . Ovarian cyst removal  2003    No family history on file.  Social History Social History  Substance Use Topics  . Smoking status: Never Smoker   . Smokeless tobacco: Never Used  . Alcohol Use: No    No Known Allergies  Current Outpatient Prescriptions  Medication Sig Dispense Refill  . azithromycin (ZITHROMAX) 500 MG tablet Take two tablets by mouth once. (Patient not taking: Reported on 04/24/2016) 2 tablet 1  . ibuprofen (ADVIL,MOTRIN) 600 MG tablet Take 1 tablet (600 mg total) by mouth every 6 (six) hours as needed. (Patient not taking: Reported on 04/24/2016) 30 tablet 0  . misoprostol (CYTOTEC) 200 MCG tablet Place 4 tabs (800 mcg) vaginally once, repeat in 12-24 hours if no bleeding (Patient not taking: Reported on 04/24/2016) 4 tablet 1  . oxyCODONE-acetaminophen (PERCOCET/ROXICET) 5-325 MG tablet Take 1 tablet by mouth every 6 (six) hours as needed for severe pain. (Patient not taking: Reported on 04/24/2016) 12 tablet 0  . Prenatal Vit-Fe Fumarate-FA (PRENATAL MULTIVITAMIN) TABS tablet Take 1 tablet by mouth daily at 12 noon. Reported on 05/22/2016    . promethazine (PHENERGAN) 12.5 MG tablet Take 1 tablet (12.5 mg total) by mouth every 6 (six) hours as needed for nausea or vomiting. (Patient not taking: Reported on 04/24/2016) 30 tablet 0   No current facility-administered medications for this visit.    Review of Systems Review of Systems  Constitutional: Negative.   Cardiovascular: Negative.   Genitourinary: Negative for  urgency, vaginal bleeding, vaginal discharge, menstrual problem and pelvic pain.    Blood pressure 109/65, pulse 89, weight 171 lb 11.2 oz (77.883 kg), last menstrual period 04/26/2016, unknown if currently breastfeeding.  Physical Exam Physical Exam  Constitutional: She appears well-developed and well-nourished. No distress.  Pulmonary/Chest: Effort normal.  Skin: Skin is warm and dry.  Psychiatric: She has a normal mood and affect. Her behavior is normal.    Data Reviewed CLINICAL DATA: Follow-up right adnexal mass (cystic teratoma). Status post spontaneous abortion on 03/24/2016.  EXAM: ULTRASOUND PELVIS TRANSVAGINAL  TECHNIQUE: Transvaginal ultrasound examination of the pelvis was performed including evaluation of the uterus, ovaries, adnexal regions, and pelvic cul-de-sac.  COMPARISON: 03/22/2016  FINDINGS: Uterus  Measurements: 8.3 x 4.7 x 6.4 cm. No fibroids or other mass visualized.  Endometrium  Thickness: 10 mm. No focal abnormality visualized.  Right ovary  Measurements: 4.0 x 2.4 x 3.5 cm. 6.9 x 4.4 x 4.9 cm right adnexal mass with fluid density and hyperechoic foci, compatible with a right ovarian dermoid, previously 5.8 x 4.2 x 4.2 cm.  Left ovary  Measurements: 3.3 x 1.6 x 1.57. Normal appearance/no adnexal mass.  Other findings: No abnormal free fluid  IMPRESSION: 6.9 x 4.4 x 4.9 cm complex right adnexal mass, compatible with a benign ovarian dermoid, grossly unchanged. Consider surgical consultation.   Electronically Signed  By: Julian Hy M.D.  On: 05/12/2016 10:30  Assessment    Right ovarian dermoid     Plan    She has applied  for medicaid and anticipates waiting another 4 weeks for approval after which laparoscopic cystectomy can be scheduled        Ibrahim Mcpheeters 05/22/2016, 4:34 PM

## 2016-06-29 ENCOUNTER — Encounter: Payer: Self-pay | Admitting: Obstetrics & Gynecology

## 2016-06-29 ENCOUNTER — Ambulatory Visit: Payer: Self-pay | Admitting: Obstetrics & Gynecology

## 2016-06-29 DIAGNOSIS — N83209 Unspecified ovarian cyst, unspecified side: Secondary | ICD-10-CM

## 2016-06-30 ENCOUNTER — Encounter (HOSPITAL_COMMUNITY): Payer: Self-pay | Admitting: *Deleted

## 2016-07-11 HISTORY — PX: LAPAROSCOPIC SALPINGOOPHERECTOMY: SUR795

## 2016-07-27 ENCOUNTER — Encounter (HOSPITAL_COMMUNITY)
Admission: RE | Admit: 2016-07-27 | Discharge: 2016-07-27 | Disposition: A | Discharge status: Home / Self Care | Payer: Self-pay | Source: Ambulatory Visit | Attending: Obstetrics & Gynecology | Admitting: Obstetrics & Gynecology

## 2016-07-27 ENCOUNTER — Encounter (HOSPITAL_COMMUNITY): Payer: Self-pay

## 2016-07-27 DIAGNOSIS — Z01812 Encounter for preprocedural laboratory examination: Secondary | ICD-10-CM | POA: Insufficient documentation

## 2016-07-27 HISTORY — DX: Nausea with vomiting, unspecified: Z98.890

## 2016-07-27 HISTORY — DX: Other specified postprocedural states: Z98.890

## 2016-07-27 HISTORY — DX: Nausea with vomiting, unspecified: R11.2

## 2016-07-27 LAB — CBC
HEMATOCRIT: 37.7 % (ref 36.0–46.0)
HEMOGLOBIN: 12.9 g/dL (ref 12.0–15.0)
MCH: 28.9 pg (ref 26.0–34.0)
MCHC: 34.2 g/dL (ref 30.0–36.0)
MCV: 84.5 fL (ref 78.0–100.0)
Platelets: 213 10*3/uL (ref 150–400)
RBC: 4.46 MIL/uL (ref 3.87–5.11)
RDW: 12.8 % (ref 11.5–15.5)
WBC: 7.8 10*3/uL (ref 4.0–10.5)

## 2016-07-27 NOTE — Patient Instructions (Signed)
Your procedure is scheduled on:  Tuesday, August 08, 2016  Enter through the Main Entrance of [facility] at:  1:45 PM  Pick up the phone at the desk and dial [phone].  Call this number if you have problems the morning of surgery: [phone].  Remember: Do NOT eat food:  After Midnight Monday, August 07, 2016  Do NOT drink clear liquids after:  11:00 AM day of surgery  Take these medicines the morning of surgery with a SIP OF WATER:  None  Do NOT wear jewelry (body piercing), metal hair clips/bobby pins, make-up, or nail polish. Do NOT wear lotions, powders, or perfumes.  You may wear deodorant. Do NOT shave for 48 hours prior to surgery. Do NOT bring valuables to the hospital. Contacts, dentures, or bridgework may not be worn into surgery.  Have a responsible adult drive you home and stay with you for 24 hours after your procedure

## 2016-08-01 ENCOUNTER — Other Ambulatory Visit: Payer: Self-pay | Admitting: Obstetrics & Gynecology

## 2016-08-07 MED ORDER — SCOPOLAMINE 1 MG/3DAYS TD PT72: 1.0000 | MEDICATED_PATCH | Freq: Once | TRANSDERMAL | Status: DC

## 2016-08-07 MED ORDER — LACTATED RINGERS IV SOLN: INTRAVENOUS | Status: DC

## 2016-08-07 MED ORDER — LACTATED RINGERS IV SOLN: Freq: Once | INTRAVENOUS | Status: DC

## 2016-08-08 ENCOUNTER — Ambulatory Visit (HOSPITAL_COMMUNITY)
Admission: RE | Admit: 2016-08-08 | Discharge: 2016-08-08 | Disposition: A | Discharge status: Home / Self Care | Payer: Self-pay | Source: Ambulatory Visit | Attending: Obstetrics & Gynecology | Admitting: Obstetrics & Gynecology

## 2016-08-08 ENCOUNTER — Ambulatory Visit (HOSPITAL_COMMUNITY): Payer: Self-pay | Admitting: Anesthesiology

## 2016-08-08 ENCOUNTER — Encounter (HOSPITAL_COMMUNITY)
Admission: RE | Disposition: A | Discharge status: Home / Self Care | Payer: Self-pay | Source: Ambulatory Visit | Attending: Obstetrics & Gynecology

## 2016-08-08 DIAGNOSIS — D270 Benign neoplasm of right ovary: Secondary | ICD-10-CM

## 2016-08-08 DIAGNOSIS — N838 Other noninflammatory disorders of ovary, fallopian tube and broad ligament: Secondary | ICD-10-CM | POA: Insufficient documentation

## 2016-08-08 HISTORY — PX: DIAGNOSTIC LARYNGOSCOPY: SHX5368

## 2016-08-08 LAB — PREGNANCY, URINE: Preg Test, Ur: NEGATIVE

## 2016-08-08 SURGERY — OOPHORECTOMY, LAPAROSCOPIC
Anesthesia: General | Site: Abdomen | Laterality: Right

## 2016-08-08 MED ORDER — KETOROLAC TROMETHAMINE 30 MG/ML IJ SOLN
INTRAMUSCULAR | Status: DC | PRN
  Administered 2016-08-08: 30 mg via INTRAVENOUS

## 2016-08-08 MED ORDER — GLYCOPYRROLATE 0.2 MG/ML IJ SOLN
INTRAMUSCULAR | Status: DC | PRN
  Administered 2016-08-08: 0.2 mg via INTRAVENOUS

## 2016-08-08 MED ORDER — OXYCODONE-ACETAMINOPHEN 5-325 MG PO TABS: 1.0000 | ORAL_TABLET | Freq: Four times a day (QID) | ORAL | 0 refills | Status: DC | PRN

## 2016-08-08 MED ORDER — DEXAMETHASONE SODIUM PHOSPHATE 10 MG/ML IJ SOLN
INTRAMUSCULAR | Status: DC | PRN
  Administered 2016-08-08: 4 mg via INTRAVENOUS

## 2016-08-08 MED ORDER — OXYCODONE-ACETAMINOPHEN 5-325 MG PO TABS: 1.0000 | ORAL_TABLET | Freq: Once | ORAL | Status: DC

## 2016-08-08 MED ORDER — SCOPOLAMINE 1 MG/3DAYS TD PT72
MEDICATED_PATCH | TRANSDERMAL | Status: AC
  Administered 2016-08-08: 1.5 mg via TRANSDERMAL
  Filled 2016-08-08: qty 1

## 2016-08-08 MED ORDER — LACTATED RINGERS IV SOLN: INTRAVENOUS | Status: DC

## 2016-08-08 MED ORDER — HYDROMORPHONE HCL 1 MG/ML IJ SOLN
INTRAMUSCULAR | Status: AC
  Filled 2016-08-08: qty 1

## 2016-08-08 MED ORDER — HYDROMORPHONE HCL 1 MG/ML IJ SOLN
0.2500 mg | INTRAMUSCULAR | Status: DC | PRN
  Administered 2016-08-08: 0.25 mg via INTRAVENOUS

## 2016-08-08 MED ORDER — SCOPOLAMINE 1 MG/3DAYS TD PT72
1.0000 | MEDICATED_PATCH | Freq: Once | TRANSDERMAL | Status: DC
  Administered 2016-08-08: 1.5 mg via TRANSDERMAL

## 2016-08-08 MED ORDER — LIDOCAINE HCL (CARDIAC) 20 MG/ML IV SOLN
INTRAVENOUS | Status: AC
  Filled 2016-08-08: qty 5

## 2016-08-08 MED ORDER — MIDAZOLAM HCL 2 MG/2ML IJ SOLN
INTRAMUSCULAR | Status: DC | PRN
  Administered 2016-08-08: 2 mg via INTRAVENOUS

## 2016-08-08 MED ORDER — ROCURONIUM BROMIDE 100 MG/10ML IV SOLN
INTRAVENOUS | Status: AC
  Filled 2016-08-08: qty 1

## 2016-08-08 MED ORDER — ROCURONIUM BROMIDE 100 MG/10ML IV SOLN
INTRAVENOUS | Status: DC | PRN
Start: 2016-08-08 — End: 2016-08-08
  Administered 2016-08-08: 40 mg via INTRAVENOUS

## 2016-08-08 MED ORDER — LACTATED RINGERS IR SOLN
Status: DC | PRN
  Administered 2016-08-08: 3000 mL

## 2016-08-08 MED ORDER — MIDAZOLAM HCL 2 MG/2ML IJ SOLN
INTRAMUSCULAR | Status: AC
  Filled 2016-08-08: qty 2

## 2016-08-08 MED ORDER — ONDANSETRON HCL 4 MG/2ML IJ SOLN
INTRAMUSCULAR | Status: AC
  Filled 2016-08-08: qty 2

## 2016-08-08 MED ORDER — KETOROLAC TROMETHAMINE 30 MG/ML IJ SOLN
INTRAMUSCULAR | Status: AC
  Filled 2016-08-08: qty 1

## 2016-08-08 MED ORDER — PROPOFOL 10 MG/ML IV BOLUS
INTRAVENOUS | Status: DC | PRN
  Administered 2016-08-08: 160 mg via INTRAVENOUS

## 2016-08-08 MED ORDER — DEXAMETHASONE SODIUM PHOSPHATE 4 MG/ML IJ SOLN
INTRAMUSCULAR | Status: AC
  Filled 2016-08-08: qty 1

## 2016-08-08 MED ORDER — LIDOCAINE HCL (CARDIAC) 20 MG/ML IV SOLN
INTRAVENOUS | Status: DC | PRN
  Administered 2016-08-08: 60 mg via INTRAVENOUS

## 2016-08-08 MED ORDER — FENTANYL CITRATE (PF) 250 MCG/5ML IJ SOLN
INTRAMUSCULAR | Status: AC
  Filled 2016-08-08: qty 5

## 2016-08-08 MED ORDER — BUPIVACAINE HCL (PF) 0.25 % IJ SOLN
INTRAMUSCULAR | Status: DC | PRN
  Administered 2016-08-08: 11 mL

## 2016-08-08 MED ORDER — IBUPROFEN 600 MG PO TABS: 600.0000 mg | ORAL_TABLET | Freq: Four times a day (QID) | ORAL | 0 refills | Status: DC | PRN

## 2016-08-08 MED ORDER — SUGAMMADEX SODIUM 200 MG/2ML IV SOLN
INTRAVENOUS | Status: AC
  Filled 2016-08-08: qty 2

## 2016-08-08 MED ORDER — PROMETHAZINE HCL 25 MG/ML IJ SOLN: 6.2500 mg | INTRAMUSCULAR | Status: DC | PRN

## 2016-08-08 MED ORDER — SUGAMMADEX SODIUM 200 MG/2ML IV SOLN
INTRAVENOUS | Status: DC | PRN
  Administered 2016-08-08: 155.2 mg via INTRAVENOUS

## 2016-08-08 MED ORDER — LACTATED RINGERS IV SOLN
INTRAVENOUS | Status: DC
  Administered 2016-08-08 (×2): via INTRAVENOUS

## 2016-08-08 MED ORDER — BUPIVACAINE HCL (PF) 0.25 % IJ SOLN
INTRAMUSCULAR | Status: AC
  Filled 2016-08-08: qty 30

## 2016-08-08 MED ORDER — FENTANYL CITRATE (PF) 100 MCG/2ML IJ SOLN
INTRAMUSCULAR | Status: DC | PRN
  Administered 2016-08-08: 100 ug via INTRAVENOUS
  Administered 2016-08-08: 50 ug via INTRAVENOUS
  Administered 2016-08-08: 100 ug via INTRAVENOUS

## 2016-08-08 MED ORDER — PROPOFOL 10 MG/ML IV BOLUS
INTRAVENOUS | Status: AC
  Filled 2016-08-08: qty 20

## 2016-08-08 MED ORDER — ONDANSETRON HCL 4 MG/2ML IJ SOLN
INTRAMUSCULAR | Status: DC | PRN
Start: 2016-08-08 — End: 2016-08-08
  Administered 2016-08-08: 4 mg via INTRAVENOUS

## 2016-08-08 SURGICAL SUPPLY — 31 items
BAG SPEC RTRVL LRG 6X4 10 (ENDOMECHANICALS)
CABLE HIGH FREQUENCY MONO STRZ (ELECTRODE) IMPLANT
CLOSURE WOUND 1/2 X4 (GAUZE/BANDAGES/DRESSINGS)
CLOTH BEACON ORANGE TIMEOUT ST (SAFETY) ×5 IMPLANT
DRSG COVADERM PLUS 2X2 (GAUZE/BANDAGES/DRESSINGS) ×4 IMPLANT
DRSG OPSITE POSTOP 3X4 (GAUZE/BANDAGES/DRESSINGS) ×3 IMPLANT
DURAPREP 26ML APPLICATOR (WOUND CARE) ×5 IMPLANT
GLOVE BIO SURGEON STRL SZ 6.5 (GLOVE) ×4 IMPLANT
GLOVE BIO SURGEONS STRL SZ 6.5 (GLOVE) ×1
GLOVE BIOGEL PI IND STRL 7.0 (GLOVE) ×6 IMPLANT
GLOVE BIOGEL PI INDICATOR 7.0 (GLOVE) ×4
GOWN STRL REUS W/TWL LRG LVL3 (GOWN DISPOSABLE) ×10 IMPLANT
LIQUID BAND (GAUZE/BANDAGES/DRESSINGS) IMPLANT
NEEDLE INSUFFLATION 120MM (ENDOMECHANICALS) ×5 IMPLANT
NS IRRIG 1000ML POUR BTL (IV SOLUTION) ×5 IMPLANT
PACK LAPAROSCOPY BASIN (CUSTOM PROCEDURE TRAY) ×5 IMPLANT
PAD TRENDELENBURG POSITION (MISCELLANEOUS) ×5 IMPLANT
POUCH SPECIMEN RETRIEVAL 10MM (ENDOMECHANICALS) IMPLANT
SET IRRIG TUBING LAPAROSCOPIC (IRRIGATION / IRRIGATOR) IMPLANT
SHEARS HARMONIC ACE PLUS 36CM (ENDOMECHANICALS) ×3 IMPLANT
SLEEVE XCEL OPT CAN 5 100 (ENDOMECHANICALS) ×7 IMPLANT
STRIP CLOSURE SKIN 1/2X4 (GAUZE/BANDAGES/DRESSINGS) IMPLANT
SUT VICRYL 0 UR6 27IN ABS (SUTURE) ×5 IMPLANT
SUT VICRYL 4-0 PS2 18IN ABS (SUTURE) ×5 IMPLANT
TOWEL OR 17X24 6PK STRL BLUE (TOWEL DISPOSABLE) ×10 IMPLANT
TRAY FOLEY CATH SILVER 14FR (SET/KITS/TRAYS/PACK) ×5 IMPLANT
TROCAR OPTI TIP 5M 100M (ENDOMECHANICALS) IMPLANT
TROCAR XCEL DIL TIP R 11M (ENDOMECHANICALS) ×5 IMPLANT
TROCAR XCEL NON-BLD 5MMX100MML (ENDOMECHANICALS) ×5 IMPLANT
WARMER LAPAROSCOPE (MISCELLANEOUS) ×5 IMPLANT
WATER STERILE IRR 1000ML POUR (IV SOLUTION) ×2 IMPLANT

## 2016-08-08 NOTE — Discharge Instructions (Signed)
DISCHARGE INSTRUCTIONS: Laparoscopy  The following instructions have been prepared to help you care for yourself upon your return home today.  Wound care:  Do not get the incision wet for the first 24 hours. The incision should be kept clean and dry.  The Band-Aids or dressings may be removed the day after surgery.  Should the incision become sore, red, and swollen after the first week, check with your doctor.  Personal hygiene:  Shower the day after your procedure.  Activity and limitations:  Do NOT drive or operate any equipment today.  Do NOT lift anything more than 15 pounds for 2-3 weeks after surgery.  Do NOT rest in bed all day.  Walking is encouraged. Walk each day, starting slowly with 5-minute walks 3 or 4 times a day. Slowly increase the length of your walks.  Walk up and down stairs slowly.  Do NOT do strenuous activities, such as golfing, playing tennis, bowling, running, biking, weight lifting, gardening, mowing, or vacuuming for 2-4 weeks. Ask your doctor when it is okay to start.  Diet: Eat a light meal as desired this evening. You may resume your usual diet tomorrow.  Return to work: This is dependent on the type of work you do. For the most part you can return to a desk job within a week of surgery. If you are more active at work, please discuss this with your doctor.  What to expect after your surgery: You may have a slight burning sensation when you urinate on the first day. You may have a very small amount of blood in the urine. Expect to have a small amount of vaginal discharge/light bleeding for 1-2 weeks. It is not unusual to have abdominal soreness and bruising for up to 2 weeks. You may be tired and need more rest for about 1 week. You may experience shoulder pain for 24-72 hours. Lying flat in bed may relieve it.  You may remove the patch behind your ear on or before Friday 08/11/16. Wash your hands with soap and water after any contact with the  patch.  Do not take ibuprofen until after 7:30pm 08/08/16.   Call your doctor for any of the following:  Develop a fever of 100.4 or greater  Inability to urinate 6 hours after discharge from hospital  Severe pain not relieved by pain medications  Persistent of heavy bleeding at incision site  Redness or swelling around incision site after a week  Increasing nausea or vomiting  Patient Signature________________________________________ Nurse Signature_________________________________________

## 2016-08-08 NOTE — Transfer of Care (Signed)
Immediate Anesthesia Transfer of Care Note  Patient: Sonia Johnson  Procedure(s) Performed: Procedure(s): LAPAROSCOPIC OOPHORECTOMY (Right) DIAGNOSTIC LARYNGOSCOPY  Patient Location: PACU  Anesthesia Type:General  Level of Consciousness: awake, alert  and oriented  Airway & Oxygen Therapy: Patient Spontanous Breathing and Patient connected to nasal cannula oxygen  Post-op Assessment: Report given to RN and Post -op Vital signs reviewed and stable  Post vital signs: Reviewed and stable  Last Vitals:  Vitals:   08/08/16 1135  BP: (!) 108/58  Pulse: 69  Resp: 18  Temp: 36.7 C    Last Pain: There were no vitals filed for this visit.       Complications: No apparent anesthesia complications

## 2016-08-08 NOTE — Anesthesia Postprocedure Evaluation (Signed)
Anesthesia Post Note  Patient: Sonia Johnson  Procedure(s) Performed: Procedure(s) (LRB): LAPAROSCOPIC SALPINGOOPHORECTOMY (Right) DIAGNOSTIC LARYNGOSCOPY  Patient location during evaluation: PACU Anesthesia Type: General Level of consciousness: awake and alert Pain management: pain level controlled Vital Signs Assessment: post-procedure vital signs reviewed and stable Respiratory status: spontaneous breathing, nonlabored ventilation, respiratory function stable and patient connected to nasal cannula oxygen Cardiovascular status: blood pressure returned to baseline and stable Postop Assessment: no signs of nausea or vomiting Anesthetic complications: no     Last Vitals:  Vitals:   08/08/16 1515 08/08/16 1530  BP: 97/62 102/60  Pulse: 84 69  Resp: 16 14  Temp:  36.4 C    Last Pain:  Vitals:   08/08/16 1530  PainSc: 2    Pain Goal: Patients Stated Pain Goal: 3 (08/08/16 1530)               Tiajuana Amass

## 2016-08-08 NOTE — Anesthesia Procedure Notes (Signed)
Procedure Name: Intubation Date/Time: 08/08/2016 12:27 PM Performed by: Jonna Munro Pre-anesthesia Checklist: Patient identified, Emergency Drugs available, Suction available, Patient being monitored and Timeout performed Patient Re-evaluated:Patient Re-evaluated prior to inductionOxygen Delivery Method: Circle system utilized Preoxygenation: Pre-oxygenation with 100% oxygen Intubation Type: IV induction Ventilation: Mask ventilation without difficulty Laryngoscope Size: Mac and 3 Grade View: Grade I Tube type: Oral Tube size: 7.0 mm Number of attempts: 1 Airway Equipment and Method: Stylet Placement Confirmation: ETT inserted through vocal cords under direct vision,  positive ETCO2,  CO2 detector and breath sounds checked- equal and bilateral Secured at: 20 cm Tube secured with: Tape Dental Injury: Teeth and Oropharynx as per pre-operative assessment

## 2016-08-08 NOTE — Op Note (Addendum)
Sonia Johnson PROCEDURE DATE: 08/08/2016  PREOPERATIVE DIAGNOSIS: Ruptured ectopic pregnancy POSTOPERATIVE DIAGNOSIS:Right ovarian dermoid cyst PROCEDURE: Laparoscopic right salpingo-oophorectomy  SURGEON:  Dr. Emeterio Reeve Asst. surgeon: Dr. Arlina Robes ANESTHESIOLOGIST: Suzette Battiest, MD Anesthesiologist: Reginal Lutes, MD; Suzette Battiest, MD CRNA: Jonna Munro, CRNA  INDICATIONS: 35 y.o. G2P1011 at Unknown here with the preoperative diagnoses as listed above.  Please refer to preoperative notes for more details. Patient was counseled regarding need for laparoscopic cystectomy and possible oophorectomy. Risks of surgery including bleeding which may require transfusion or reoperation, infection, injury to bowel or other surrounding organs, need for additional procedures including laparotomy and other postoperative/anesthesia complications were explained to patient.  Written informed consent was obtained.  FINDINGS:  Cystic enlargement of right ovary, left ovary and left tube normal  ANESTHESIA: General INTRAVENOUS FLUIDS: 1000 ml ESTIMATED BLOOD LOSS: negligible URINE OUTPUT: 100 ml SPECIMENS: Right fallopian tube and ovarian with dermoid cyst COMPLICATIONS: None immediate  PROCEDURE IN DETAIL:  The patient was taken to the operating room where general anesthesia was administered and was found to be adequate.  She was placed in the dorsal lithotomy position, and was prepped and draped in a sterile manner.  A Foley catheter was inserted into her bladder and attached to constant drainage and a uterine manipulator was then advanced into the uterus .    After an adequate timeout was performed, attention was turned to the abdomen where an umbilical incision was made with the scalpel.  The 5 mm trocar and sleeve were then advanced without difficulty with the laparoscope under direct visualization into the abdomen.  The abdomen was then insufflated with carbon dioxide gas and  adequate pneumoperitoneum was obtained.  A survey of the patient's pelvis and abdomen revealed the findings above.  A 5-mm left lower quadrant port and 23mm right port were then placed under direct visualization. Attention was then turned to the right ovary which was dilated consistent with the dermoid cyst that was diagnosed by ultrasound. Ovary appeared to be 6-7 cm x 5 cm. Several smaller cysts were seen on the surface. The overlying cortex was dissected from the cyst using the Harmonic scalpel. The cyst ruptured during the procedure and cloudy fluid and hair was seen consistent with dermoid cyst. The cyst was further dissected  from the ovary. The cortex was very thin. Little if any normal ovary appeared to be left after dissection of the cyst. It was elected to proceed with oophorectomy. The infundibulopelvic ligament was identified and was transected using the Harmonic scalpel  good hemostasis seen. The specimen was placed in an EndoCatch bag and removed from the abdomen intact.  The right fallopian tube was likewise transected using the Harmonic scalpel and was removed as well. The pelvis was copiously irrigated. The abdomen was desufflated, and all instruments were removed.  The fascial incision of the 10-mm site was reapproximated with a 0 Vicryl figure-of-eight stitch; and all skin incisions were closed with 4-0 Vicryl and Dermabond. The patient tolerated the procedure well.  All instruments, needles, and sponge counts were correct x 2. The patient was taken to the recovery room in stable condition.   The patient will be discharged to home as per PACU criteria.  Routine postoperative instructions given.  She was prescribed Percocet, Ibuprofen   She will follow up in the clinic in about 2-3 weeks for postoperative evaluation.    Woodroe Mode, MD [place] Attending [place], [facility]

## 2016-08-08 NOTE — Anesthesia Preprocedure Evaluation (Addendum)
Anesthesia Evaluation  Patient identified by MRN, date of birth, ID band Patient awake    Reviewed: Allergy & Precautions, NPO status , Patient's Chart, lab work & pertinent test results  History of Anesthesia Complications (+) PONV and history of anesthetic complications  Airway Mallampati: I  TM Distance: >3 FB Neck ROM: Full    Dental no notable dental hx.    Pulmonary neg pulmonary ROS,    Pulmonary exam normal        Cardiovascular negative cardio ROS Normal cardiovascular exam     Neuro/Psych negative neurological ROS  negative psych ROS   GI/Hepatic negative GI ROS, Neg liver ROS,   Endo/Other  negative endocrine ROS  Renal/GU negative Renal ROS  negative genitourinary   Musculoskeletal negative musculoskeletal ROS (+)   Abdominal   Peds negative pediatric ROS (+)  Hematology negative hematology ROS (+)   Anesthesia Other Findings   Reproductive/Obstetrics negative OB ROS                            Anesthesia Physical Anesthesia Plan  ASA: I  Anesthesia Plan: General   Post-op Pain Management:    Induction: Intravenous  Airway Management Planned: Oral ETT  Additional Equipment:   Intra-op Plan:   Post-operative Plan: Extubation in OR  Informed Consent: I have reviewed the patients History and Physical, chart, labs and discussed the procedure including the risks, benefits and alternatives for the proposed anesthesia with the patient or authorized representative who has indicated his/her understanding and acceptance.   Dental advisory given  Plan Discussed with:   Anesthesia Plan Comments:         Anesthesia Quick Evaluation

## 2016-08-08 NOTE — H&P (Signed)
Patient ID: Sonia Johnson, female   DOB: 09/09/1981, 35 y.o.   MRN: [id number]     Chief Complaint  Patient presents with  . surgical consult  ovarian dermoid cyst  HPI Garnett Goertz is a 35 y.o. female.  G2P1001 Patient's last menstrual period was 04/26/2016. [place] on 6/2 confirms no change of ovarian dermoid and needs surgical removal. No pain or menstrual problem  HPI  No past medical history on file.       Past Surgical History  Procedure Laterality Date  . Ovarian cyst removal  2003    No family history on file.  Social History     Social History  Substance Use Topics  . Smoking status: Never Smoker   . Smokeless tobacco: Never Used  . Alcohol Use: No    No Known Allergies        Current Outpatient Prescriptions  Medication Sig Dispense Refill  . azithromycin (ZITHROMAX) 500 MG tablet Take two tablets by mouth once. (Patient not taking: Reported on 04/24/2016) 2 tablet 1  . ibuprofen (ADVIL,MOTRIN) 600 MG tablet Take 1 tablet (600 mg total) by mouth every 6 (six) hours as needed. (Patient not taking: Reported on 04/24/2016) 30 tablet 0  . misoprostol (CYTOTEC) 200 MCG tablet Place 4 tabs (800 mcg) vaginally once, repeat in 12-24 hours if no bleeding (Patient not taking: Reported on 04/24/2016) 4 tablet 1  . oxyCODONE-acetaminophen (PERCOCET/ROXICET) 5-325 MG tablet Take 1 tablet by mouth every 6 (six) hours as needed for severe pain. (Patient not taking: Reported on 04/24/2016) 12 tablet 0  . Prenatal Vit-Fe Fumarate-FA (PRENATAL MULTIVITAMIN) TABS tablet Take 1 tablet by mouth daily at 12 noon. Reported on 05/22/2016    . promethazine (PHENERGAN) 12.5 MG tablet Take 1 tablet (12.5 mg total) by mouth every 6 (six) hours as needed for nausea or vomiting. (Patient not taking: Reported on 04/24/2016) 30 tablet 0   No current facility-administered medications for this visit.    Review of Systems Review of Systems  Constitutional: Negative.   Cardiovascular:  Negative.   Genitourinary: Negative for urgency, vaginal bleeding, vaginal discharge, menstrual problem and pelvic pain.    Blood pressure (!) 108/58, pulse 69, temperature 98.1 F (36.7 C), resp. rate 18, last menstrual period 07/07/2016, SpO2 100 %, unknown if currently breastfeeding.  Physical Exam Physical Exam  Constitutional: She appears well-developed and well-nourished. No distress.  Pulmonary/Chest: Effort normal.  Skin: Skin is warm and dry.  Psychiatric: She has a normal mood and affect. Her behavior is normal.    Data Reviewed CLINICAL DATA: Follow-up right adnexal mass (cystic teratoma). Status post spontaneous abortion on 03/24/2016.  EXAM: ULTRASOUND PELVIS TRANSVAGINAL  TECHNIQUE: Transvaginal ultrasound examination of the pelvis was performed including evaluation of the uterus, ovaries, adnexal regions, and pelvic cul-de-sac.  COMPARISON: 03/22/2016  FINDINGS: Uterus  Measurements: 8.3 x 4.7 x 6.4 cm. No fibroids or other mass visualized.  Endometrium  Thickness: 10 mm. No focal abnormality visualized.  Right ovary  Measurements: 4.0 x 2.4 x 3.5 cm. 6.9 x 4.4 x 4.9 cm right adnexal mass with fluid density and hyperechoic foci, compatible with a right ovarian dermoid, previously 5.8 x 4.2 x 4.2 cm.  Left ovary  Measurements: 3.3 x 1.6 x 1.57. Normal appearance/no adnexal mass.  Other findings: No abnormal free fluid  IMPRESSION: 6.9 x 4.4 x 4.9 cm complex right adnexal mass, compatible with a benign ovarian dermoid, grossly unchanged. Consider surgical consultation.   Electronically Signed  By: Henderson Newcomer.D.  On: 05/12/2016 10:30  Assessment    Right ovarian dermoid     Plan Laparoscopic right ovarian cystectomy, possible oophorectomy.  The risks of surgery were discussed in detail with the patient including but not limited to: bleeding which may require transfusion or reoperation; infection which  may require prolonged hospitalization or re-hospitalization and antibiotic therapy; injury to bowel, bladder, ureters and major vessels or other surrounding organs; need for additional procedures including laparotomy; thromboembolic phenomenon, incisional problems and other postoperative or anesthesia complications.  Patient was told that the likelihood that her condition and symptoms will be treated effectively with this surgical management was very high; the postoperative expectations were also discussed in detail. The patient also understands the alternative treatment options which were discussed in full. All questions were answered.     Woodroe Mode, MD 08/08/2016 11:51 AM

## 2016-08-09 ENCOUNTER — Encounter (HOSPITAL_COMMUNITY): Payer: Self-pay | Admitting: Obstetrics & Gynecology

## 2016-08-24 ENCOUNTER — Encounter: Payer: Self-pay | Admitting: Obstetrics & Gynecology

## 2016-08-24 ENCOUNTER — Ambulatory Visit (INDEPENDENT_AMBULATORY_CARE_PROVIDER_SITE_OTHER): Payer: Self-pay | Admitting: Obstetrics & Gynecology

## 2016-08-24 VITALS — BP 109/88 | HR 82 | Temp 99.3°F | Ht 64.0 in | Wt 169.6 lb

## 2016-08-24 DIAGNOSIS — D270 Benign neoplasm of right ovary: Secondary | ICD-10-CM

## 2016-08-24 LAB — POCT URINALYSIS DIP (DEVICE)
BILIRUBIN URINE: NEGATIVE
GLUCOSE, UA: NEGATIVE mg/dL
KETONES UR: NEGATIVE mg/dL
NITRITE: NEGATIVE
PROTEIN: NEGATIVE mg/dL
Specific Gravity, Urine: 1.02 (ref 1.005–1.030)
Urobilinogen, UA: 1 mg/dL (ref 0.0–1.0)
pH: 5.5 (ref 5.0–8.0)

## 2016-08-24 NOTE — Patient Instructions (Signed)
Ovarian Cystectomy, Care After Refer to this sheet in the next few weeks. These instructions provide you with information on caring for yourself after your procedure. Your health care provider may also give you more specific instructions. Your treatment has been planned according to current medical practices, but problems sometimes occur. Call your health care provider if you have any problems or questions after your procedure.  WHAT TO EXPECT AFTER THE PROCEDURE After your procedure, it is typical to have the following:  Pain in your abdomen, especially at the incision sites. You will be given pain medicines to control the pain.  Tiredness. This is a normal part of the recovery process. Your energy level will return to normal over the next several weeks.  Constipation. HOME CARE INSTRUCTIONS  Only take over-the-counter or prescription medicines as directed by your health care provider. Avoid taking aspirin because it can cause bleeding.   Follow your health care provider's instructions for when to resume your regular diet, exercise, and activities.   Take rest breaks during the day as needed.  Do not douche or have sexual intercourse until you have permission from your health care provider.   Remove or change any bandages (dressings) as directed by your health care provider.   Do not drive until your health care provider approves.   Take showers instead of baths until your health care provider tells you otherwise.   If you become constipated, you may:   Use a mild laxative if your health care provider approves.  Add more fruit and bran to your diet.  Drink more fluids.  Take your temperature twice a day and record it.   Do not drink alcohol while taking pain medicine.   Try to have someone home with you for the first 1-2 weeks to help with your household activities.   Follow up with your health care provider as directed. SEEK MEDICAL CARE IF:  You have a fever.    You feel sick to your stomach (nauseous) and throw up (vomit).   You have redness, swelling, or leakage of fluid at the incision site.   You have pain when you urinate or have blood in your urine.   You have a rash on your body.   You have pain or redness where the IV tube was inserted.   You have pain that is not relieved with medicine.  SEEK IMMEDIATE MEDICAL CARE IF:  You have chest pain or shortness of breath.   You feel dizzy or lightheaded.   You have increasing abdominal pain that is not relieved with medicines.   You have pain, swelling, or redness in your leg.   You see a yellowish white fluid (pus) coming from the incision.   Your incision is opening (edges not staying together).    This information is not intended to replace advice given to you by your health care provider. Make sure you discuss any questions you have with your health care provider.   Document Released: 09/17/2013 Document Reviewed: 09/17/2013 Elsevier Interactive Patient Education [employer].

## 2016-08-24 NOTE — Progress Notes (Signed)
Subjective:s/p RSO for ovarian dermoid     Sonia Johnson is a 34 y.o. female who presents to the clinic 3 weeks status post right oophorectomy for adnexal mass and dermoid. Eating a regular diet without difficulty. Bowel movements are normal. The patient is not having any pain.  The following portions of the patient's history were reviewed and updated as appropriate: allergies, current medications, past family history, past medical history, past social history, past surgical history and problem list.  Review of Systems Pertinent items are noted in HPI.    Objective:    BP 109/88   Pulse 82   Temp 99.3 F (37.4 C)   Ht 5\' 4"  (1.626 m)   Wt 169 lb 9.6 oz (76.9 kg)   LMP 07/07/2016 (Exact Date)   BMI 29.11 kg/m  General:  alert, cooperative and no distress  Abdomen: soft, non-tender  Incision:   healing well, no drainage, no erythema, no hernia, no seroma, no swelling, no dehiscence, incision well approximated     Assessment:    Doing well postoperatively. Operative findings again reviewed. Pathology report discussed.    Plan:    1. Continue any current medications. 2. Wound care discussed. 3. Activity restrictions: none 4. Anticipated return to work: now. 5. Follow up:prn  Woodroe Mode, MD 08/24/2016

## 2016-09-24 IMAGING — US US TRANSVAGINAL NON-OB
1 series · 15 of 25 positions shown · non-contrast
Comparison: 03/22/2016

CLINICAL DATA: Follow-up right adnexal mass (cystic teratoma).
Status post spontaneous abortion on 03/24/2016.

EXAM:
ULTRASOUND PELVIS TRANSVAGINAL
TECHNIQUE: Transvaginal ultrasound examination of the pelvis was performed
including evaluation of the uterus, ovaries, adnexal regions, and
pelvic cul-de-sac.

[Series 1: us transvaginal non-ob · 15 of 43 slices shown]
[im 1/43]
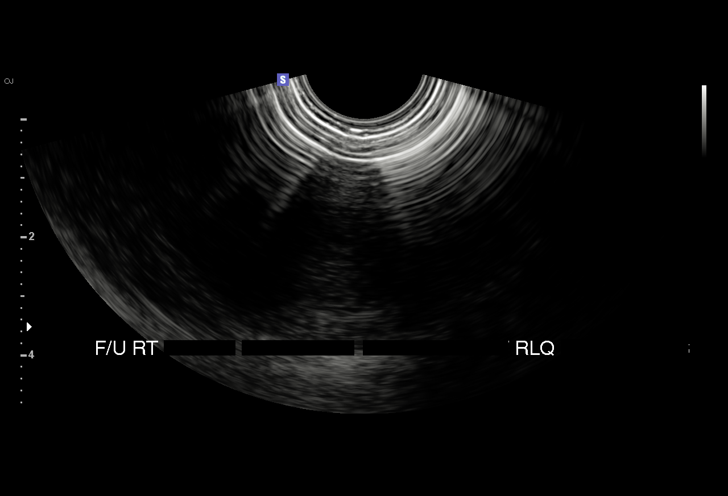
[im 4/43]
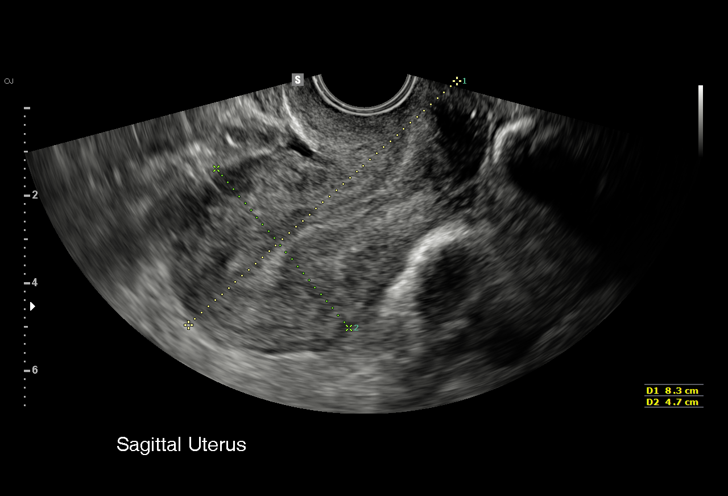
[im 8/43]
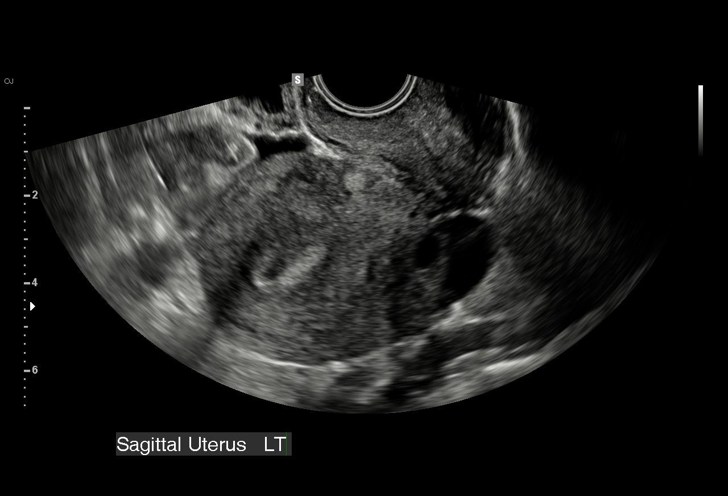
[im 9/43]
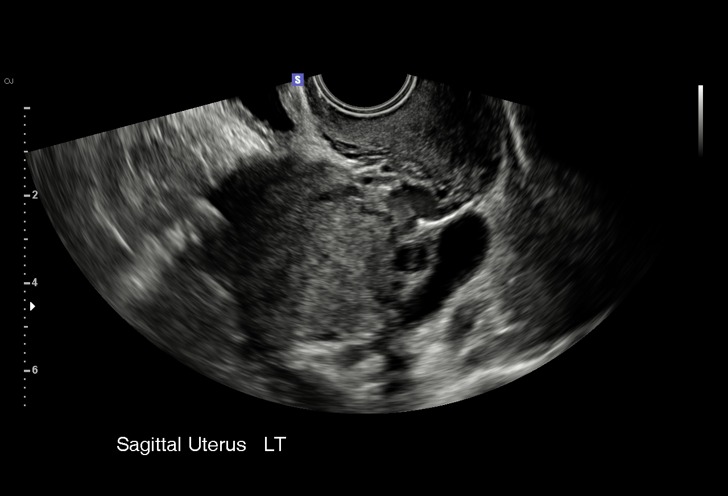
[im 13/43]
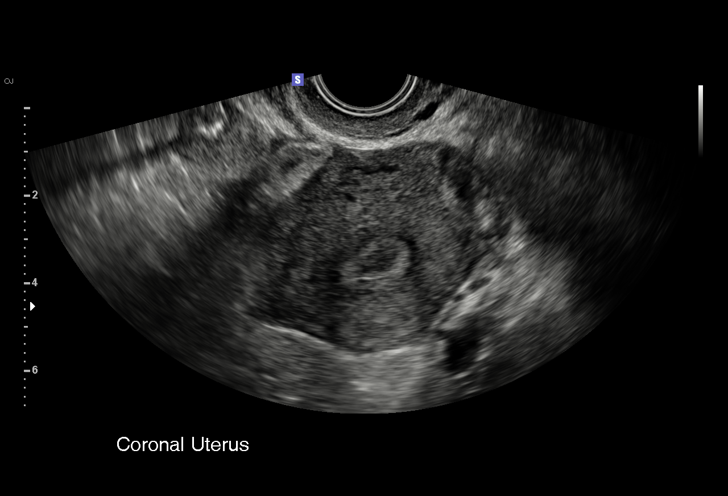
[im 16/43]
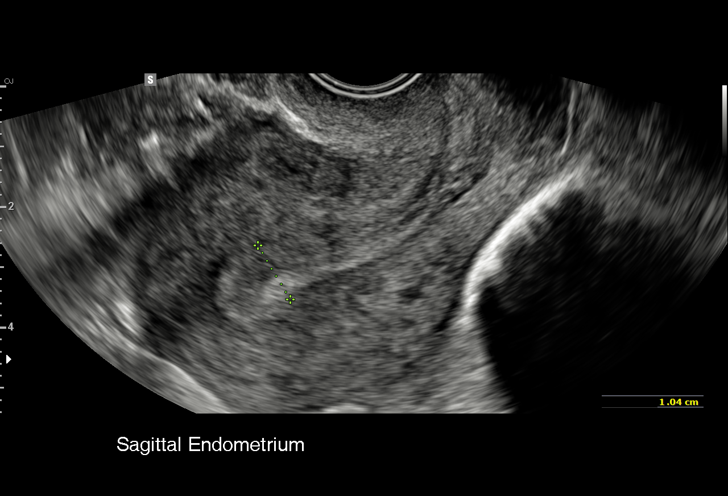
[im 18/43]
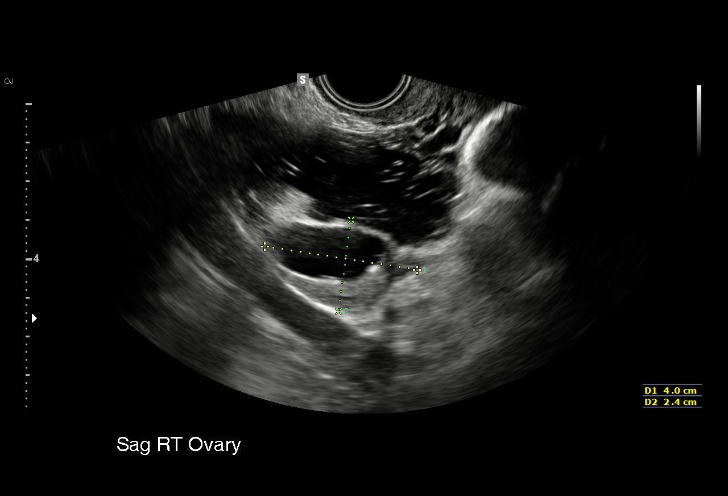
[im 22/43]
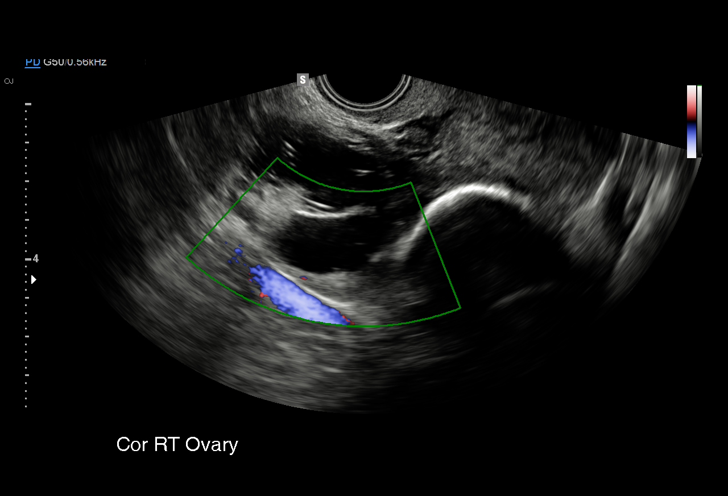
[im 25/43]
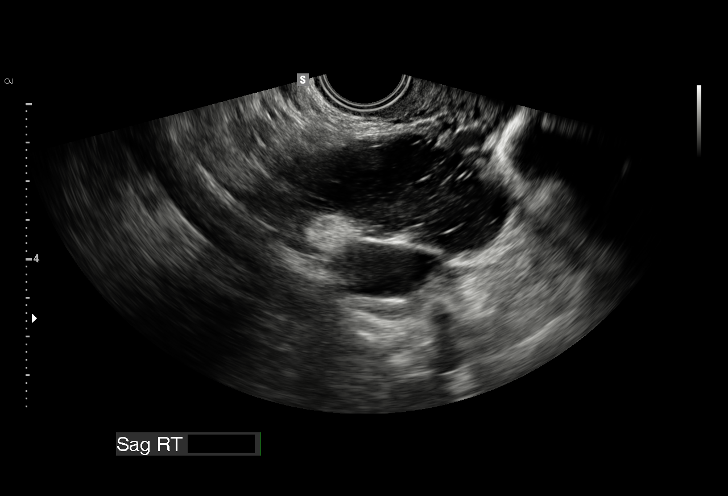
[im 27/43]
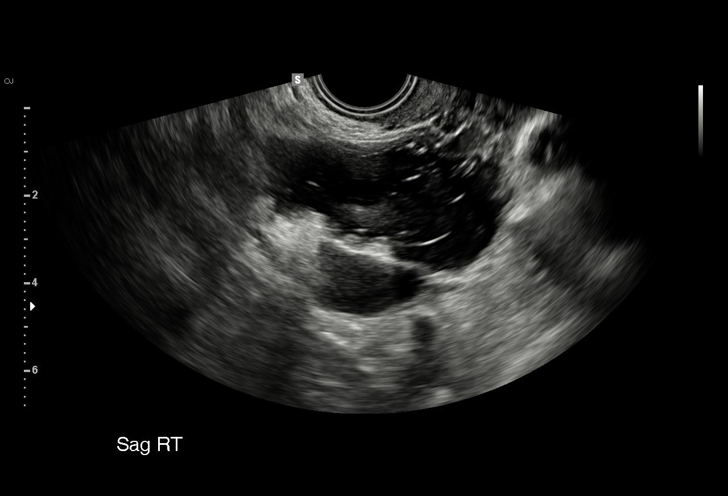
[im 30/43]
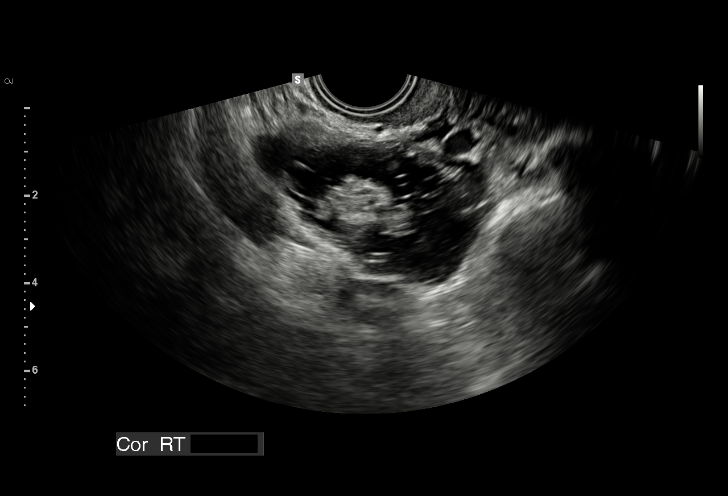
[im 34/43]
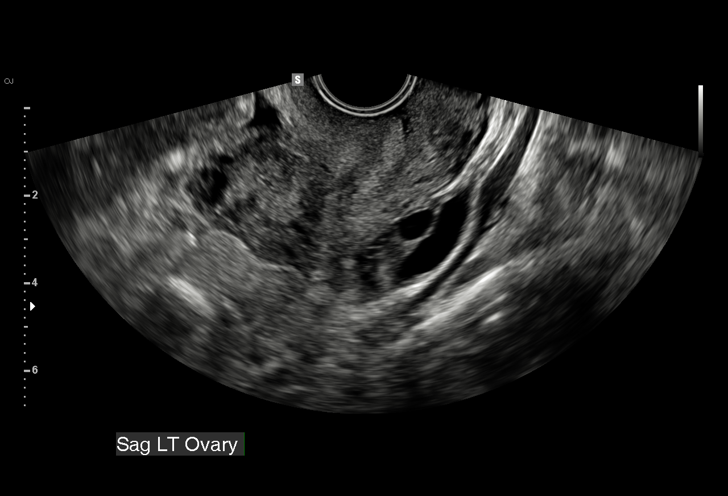
[im 36/43]
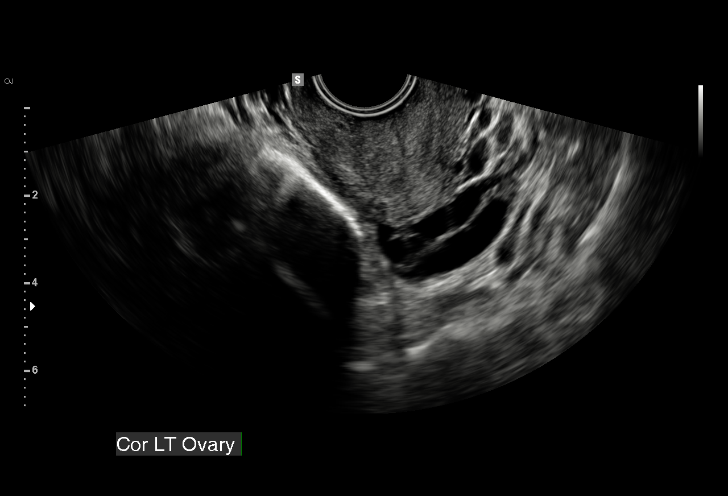
[im 39/43]
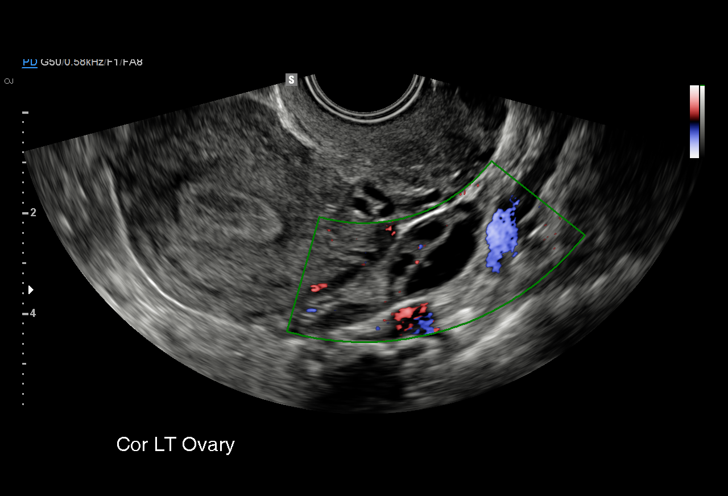
[im 43/43]
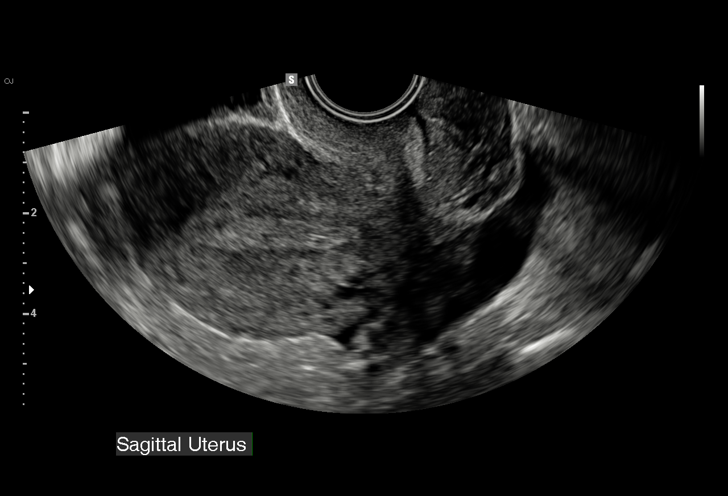

[15 of 25 positions shown; findings below may reference images not displayed]

FINDINGS: Uterus

Measurements: 8.3 x 4.7 x 6.4 cm. No fibroids or other mass
visualized.

Endometrium

Thickness: 10 mm.  No focal abnormality visualized.

Right ovary

Measurements: 4.0 x 2.4 x 3.5 cm. 6.9 x 4.4 x 4.9 cm right adnexal
mass with fluid density and hyperechoic foci, compatible with a
right ovarian dermoid, previously 5.8 x 4.2 x 4.2 cm.

Left ovary

Measurements: 3.3 x 1.6 x 1.57. Normal appearance/no adnexal mass.

Other findings:  No abnormal free fluid
IMPRESSION: 6.9 x 4.4 x 4.9 cm complex right adnexal mass, compatible with a
benign ovarian dermoid, grossly unchanged. Consider surgical
consultation.

## 2017-05-08 ENCOUNTER — Encounter (HOSPITAL_COMMUNITY): Payer: Self-pay | Admitting: *Deleted

## 2017-05-08 ENCOUNTER — Inpatient Hospital Stay (HOSPITAL_COMMUNITY)
Admission: AD | Admit: 2017-05-08 | Discharge: 2017-05-08 | Disposition: A | Discharge status: Home / Self Care | Payer: Self-pay | Source: Ambulatory Visit | Attending: Family Medicine | Admitting: Family Medicine

## 2017-05-08 DIAGNOSIS — F419 Anxiety disorder, unspecified: Secondary | ICD-10-CM | POA: Insufficient documentation

## 2017-05-08 DIAGNOSIS — Z3202 Encounter for pregnancy test, result negative: Secondary | ICD-10-CM

## 2017-05-08 DIAGNOSIS — N946 Dysmenorrhea, unspecified: Secondary | ICD-10-CM | POA: Insufficient documentation

## 2017-05-08 LAB — WET PREP, GENITAL
Sperm: NONE SEEN
Trich, Wet Prep: NONE SEEN
Yeast Wet Prep HPF POC: NONE SEEN

## 2017-05-08 LAB — URINALYSIS, ROUTINE W REFLEX MICROSCOPIC
BILIRUBIN URINE: NEGATIVE
Glucose, UA: NEGATIVE mg/dL
KETONES UR: NEGATIVE mg/dL
LEUKOCYTES UA: NEGATIVE
Nitrite: NEGATIVE
PH: 5 (ref 5.0–8.0)
Protein, ur: NEGATIVE mg/dL
Specific Gravity, Urine: 1.024 (ref 1.005–1.030)

## 2017-05-08 LAB — CBC
HCT: 41.7 % (ref 36.0–46.0)
Hemoglobin: 14.1 g/dL (ref 12.0–15.0)
MCH: 29.4 pg (ref 26.0–34.0)
MCHC: 33.8 g/dL (ref 30.0–36.0)
MCV: 86.9 fL (ref 78.0–100.0)
PLATELETS: 238 10*3/uL (ref 150–400)
RBC: 4.8 MIL/uL (ref 3.87–5.11)
RDW: 12.9 % (ref 11.5–15.5)
WBC: 5.9 10*3/uL (ref 4.0–10.5)

## 2017-05-08 LAB — POCT PREGNANCY, URINE: Preg Test, Ur: NEGATIVE

## 2017-05-08 LAB — HCG, QUANTITATIVE, PREGNANCY: HCG, BETA CHAIN, QUANT, S: 2 m[IU]/mL (ref <5)

## 2017-05-08 NOTE — Discharge Instructions (Signed)
Dysmenorrhea Dysmenorrhea means painful cramps during your period (menstrual period). You will have pain in your lower belly (abdomen). The pain is caused by the tightening (contracting) of the muscles of the womb (uterus). The pain may be mild or very bad. With this condition, you may:  Have a headache.  Feel sick to your stomach (nauseous).  Throw up (vomit).  Have lower back pain. Follow these instructions at home: Helping pain and cramping   Put heat on your lower back or belly when you have pain or cramps. Use the heat source that your doctor tells you to use.  Place a towel between your skin and the heat.  Leave the heat on for 20-30 minutes.  Remove the heat if your skin turns bright red. This is especially important if you cannot feel pain, heat, or cold.  Do not have a heating pad on during sleep.  Do aerobic exercises. These include walking, swimming, or biking. These may help with cramps.  Massage your lower back or belly. This may help lessen pain. General instructions   Take over-the-counter and prescription medicines only as told by your doctor.  Do not drive or use heavy machinery while taking prescription pain medicine.  Avoid alcohol and caffeine during and right before your period. These can make cramps worse.  Do not use any products that have nicotine or tobacco. These include cigarettes and e-cigarettes. If you need help quitting, ask your doctor.  Keep all follow-up visits as told by your doctor. This is important. Contact a doctor if:  You have pain that gets worse.  You have pain that does not get better with medicine.  You have pain during sex.  You feel sick to your stomach or you throw up during your period, and medicine does not help. Get help right away if:  You pass out (faint). Summary  Dysmenorrhea means painful cramps during your period (menstrual period).  Put heat on your lower back or belly when you have pain or cramps.  Do  exercises like walking, swimming, or biking to help with cramps.  Contact a doctor if you have pain during sex. This information is not intended to replace advice given to you by your health care provider. Make sure you discuss any questions you have with your health care provider. Document Released: 02/23/2009 Document Revised: 12/14/2016 Document Reviewed: 12/14/2016 Elsevier Interactive Patient Education  2017 Elsevier Inc.  

## 2017-05-08 NOTE — MAU Provider Note (Signed)
History     CSN: [id number]  Arrival date and time: 05/08/17 0857   First Provider Initiated Contact with Patient 05/08/17 [phone]      [id number] here with VB. She had +HPT last week. Bleeding started yesterday. She describes as small amt brown blood yesterday and is red today. She has used 3 pads since but not saturated. Also reports LAP that started yesterday and has improved since. She has not used anything for it. No fevers or chills.     OB History    Gravida Para Term Preterm AB Living   2 1 1   1 1    SAB TAB Ectopic Multiple Live Births   1              Past Medical History:  Diagnosis Date  . Anxiety   . PONV (postoperative nausea and vomiting)     Past Surgical History:  Procedure Laterality Date  . DIAGNOSTIC LARYNGOSCOPY  08/08/2016   Procedure: DIAGNOSTIC LARYNGOSCOPY;  Surgeon: Woodroe Mode, MD;  Location: [facility];  Service: Gynecology;;  . OVARIAN CYST REMOVAL  2003    History reviewed. No pertinent family history.  Social History  Substance Use Topics  . Smoking status: Never Smoker  . Smokeless tobacco: Never Used  . Alcohol use No    Allergies: No Known Allergies  Prescriptions Prior to Admission  Medication Sig Dispense Refill Last Dose  . ibuprofen (ADVIL,MOTRIN) 600 MG tablet Take 1 tablet (600 mg total) by mouth every 6 (six) hours as needed. 30 tablet 0 Taking  . oxyCODONE-acetaminophen (PERCOCET/ROXICET) 5-325 MG tablet Take 1-2 tablets by mouth every 6 (six) hours as needed. 20 tablet 0 Taking    Review of Systems  Constitutional: Negative for fever.  Gastrointestinal: Positive for abdominal pain.  Genitourinary: Positive for vaginal bleeding. Negative for dysuria and frequency.   Physical Exam   Blood pressure 115/63, pulse 77, temperature 98.3 F (36.8 C), temperature source Oral, resp. rate 16, height 5\' 4"  (1.626 m), weight 76.7 kg (169 lb), last menstrual period 04/05/2017, SpO2 99 %, unknown if currently breastfeeding.  Physical  Exam  Constitutional: She is oriented to person, place, and time. She appears well-developed and well-nourished. No distress (appears comfortable).  HENT:  Head: Normocephalic and atraumatic.  Neck: Normal range of motion.  Cardiovascular: Normal rate.   Respiratory: Effort normal.  GI: Soft. She exhibits no distension and no mass. There is no tenderness. There is no rebound and no guarding.  Genitourinary:  Genitourinary Comments: External: no lesions or erythema Vagina: rugated, parous, small drk bloody discharge Uterus: non enlarged, anteverted, + tender, no CMT Adnexae: no masses, no tenderness left, no tenderness right   Musculoskeletal: Normal range of motion.  Neurological: She is alert and oriented to person, place, and time.  Skin: Skin is warm and dry.  Psychiatric: She has a normal mood and affect.   Results for orders placed or performed during the hospital encounter of 05/08/17 (from the past 24 hour(s))  Urinalysis, Routine w reflex microscopic     Status: Abnormal   Collection Time: 05/08/17  9:02 AM  Result Value Ref Range   Color, Urine YELLOW YELLOW   APPearance HAZY (A) CLEAR   Specific Gravity, Urine 1.024 1.005 - 1.030   pH 5.0 5.0 - 8.0   Glucose, UA NEGATIVE NEGATIVE mg/dL   Hgb urine dipstick LARGE (A) NEGATIVE   Bilirubin Urine NEGATIVE NEGATIVE   Ketones, ur NEGATIVE NEGATIVE mg/dL   Protein, ur NEGATIVE  NEGATIVE mg/dL   Nitrite NEGATIVE NEGATIVE   Leukocytes, UA NEGATIVE NEGATIVE   RBC / HPF TOO NUMEROUS TO COUNT 0 - 5 RBC/hpf   WBC, UA 6-30 0 - 5 WBC/hpf   Bacteria, UA RARE (A) NONE SEEN   Squamous Epithelial / LPF 0-5 (A) NONE SEEN   Mucous PRESENT   Wet prep, genital     Status: Abnormal   Collection Time: 05/08/17  9:30 AM  Result Value Ref Range   Yeast Wet Prep HPF POC NONE SEEN NONE SEEN   Trich, Wet Prep NONE SEEN NONE SEEN   Clue Cells Wet Prep HPF POC PRESENT (A) NONE SEEN   WBC, Wet Prep HPF POC FEW (A) NONE SEEN   Sperm NONE SEEN    Pregnancy, urine POC     Status: None   Collection Time: 05/08/17  9:56 AM  Result Value Ref Range   Preg Test, Ur NEGATIVE NEGATIVE  CBC     Status: None   Collection Time: 05/08/17  9:57 AM  Result Value Ref Range   WBC 5.9 4.0 - 10.5 K/uL   RBC 4.80 3.87 - 5.11 MIL/uL   Hemoglobin 14.1 12.0 - 15.0 g/dL   HCT 41.7 36.0 - 46.0 %   MCV 86.9 78.0 - 100.0 fL   MCH 29.4 26.0 - 34.0 pg   MCHC 33.8 30.0 - 36.0 g/dL   RDW 12.9 11.5 - 15.5 %   Platelets 238 150 - 400 K/uL  hCG, quantitative, pregnancy     Status: None   Collection Time: 05/08/17  9:57 AM  Result Value Ref Range   hCG, Beta Chain, Quant, S 2 <5 mIU/mL   MAU Course  Procedures  MDM Labs ordered and reviewed. No evidence of recent pregnancy. Bleeding likely menstrual. HPT could be false positive. Can use Ibuprofen or Aleve for pain. Stable for discharge home.  Assessment and Plan   1. Negative pregnancy test   2. Menses painful    Discharge home Follow up at [facility] as needed Ibuprofen or Aleve prn  Allergies as of 05/08/2017   No Known Allergies     Medication List    STOP taking these medications   ibuprofen 600 MG tablet Commonly known as:  ADVIL,MOTRIN   oxyCODONE-acetaminophen 5-325 MG tablet Commonly known as:  PERCOCET/ROXICET      Julianne Handler, CNM 05/08/2017, 9:28 AM

## 2017-05-08 NOTE — MAU Note (Signed)
Pt reports a positive home preg test last week and she started bleeding yesterday, has continued to bleed and started having pain last pm. States she called the [facility] today and they told her to come here.

## 2017-05-09 LAB — GC/CHLAMYDIA PROBE AMP (~~LOC~~) NOT AT ARMC
Chlamydia: NEGATIVE
Neisseria Gonorrhea: NEGATIVE

## 2017-08-10 ENCOUNTER — Inpatient Hospital Stay (HOSPITAL_COMMUNITY): Payer: Self-pay

## 2017-08-10 ENCOUNTER — Inpatient Hospital Stay (HOSPITAL_COMMUNITY)
Admission: AD | Admit: 2017-08-10 | Discharge: 2017-08-10 | Disposition: A | Discharge status: Home / Self Care | Payer: Self-pay | Source: Ambulatory Visit | Attending: Obstetrics & Gynecology | Admitting: Obstetrics & Gynecology

## 2017-08-10 ENCOUNTER — Encounter (HOSPITAL_COMMUNITY): Payer: Self-pay

## 2017-08-10 DIAGNOSIS — O3680X0 Pregnancy with inconclusive fetal viability, not applicable or unspecified: Secondary | ICD-10-CM

## 2017-08-10 DIAGNOSIS — M545 Low back pain: Secondary | ICD-10-CM | POA: Insufficient documentation

## 2017-08-10 DIAGNOSIS — O26899 Other specified pregnancy related conditions, unspecified trimester: Secondary | ICD-10-CM

## 2017-08-10 DIAGNOSIS — O26891 Other specified pregnancy related conditions, first trimester: Secondary | ICD-10-CM | POA: Insufficient documentation

## 2017-08-10 DIAGNOSIS — Z3A01 Less than 8 weeks gestation of pregnancy: Secondary | ICD-10-CM | POA: Insufficient documentation

## 2017-08-10 DIAGNOSIS — N898 Other specified noninflammatory disorders of vagina: Secondary | ICD-10-CM | POA: Insufficient documentation

## 2017-08-10 DIAGNOSIS — R109 Unspecified abdominal pain: Secondary | ICD-10-CM | POA: Insufficient documentation

## 2017-08-10 HISTORY — DX: Unspecified ovarian cyst, unspecified side: N83.209

## 2017-08-10 LAB — WET PREP, GENITAL
Clue Cells Wet Prep HPF POC: NONE SEEN
Sperm: NONE SEEN
Trich, Wet Prep: NONE SEEN
YEAST WET PREP: NONE SEEN

## 2017-08-10 LAB — URINALYSIS, ROUTINE W REFLEX MICROSCOPIC
BILIRUBIN URINE: NEGATIVE
GLUCOSE, UA: NEGATIVE mg/dL
Ketones, ur: NEGATIVE mg/dL
NITRITE: NEGATIVE
PROTEIN: NEGATIVE mg/dL
Specific Gravity, Urine: 1.008 (ref 1.005–1.030)
pH: 5 (ref 5.0–8.0)

## 2017-08-10 LAB — HCG, QUANTITATIVE, PREGNANCY: HCG, BETA CHAIN, QUANT, S: 6586 m[IU]/mL — AB (ref <5)

## 2017-08-10 LAB — CBC
HCT: 37.7 % (ref 36.0–46.0)
Hemoglobin: 12.8 g/dL (ref 12.0–15.0)
MCH: 29.3 pg (ref 26.0–34.0)
MCHC: 34 g/dL (ref 30.0–36.0)
MCV: 86.3 fL (ref 78.0–100.0)
PLATELETS: 242 10*3/uL (ref 150–400)
RBC: 4.37 MIL/uL (ref 3.87–5.11)
RDW: 14.1 % (ref 11.5–15.5)
WBC: 9.2 10*3/uL (ref 4.0–10.5)

## 2017-08-10 LAB — POCT PREGNANCY, URINE: PREG TEST UR: POSITIVE — AB

## 2017-08-10 NOTE — MAU Note (Signed)
Pt reports she has been having pain in her lower back that radiates around to the lower abd. Positive preg test at the [facility]. Denies bleeding.

## 2017-08-10 NOTE — MAU Provider Note (Signed)
History     CSN: [id number]  Arrival date and time: 08/10/17 1752  None     Chief Complaint  Patient presents with  . Back Pain  . Abdominal Pain  . Possible Pregnancy   HPI Sonia Johnson is a 36 y.o. G3P1011 at [redacted]w[redacted]d by LMP who presents with abdominal pain. Pregnancy confirmed at [facility]. Pain started yesterday. Describes as intermittent lower abdominal cramping that radiates to her low back. Rates pain 4/10. Has not treated. Denies n/v/d, constipation, dysuria, fever/chills, or vaginal bleeding. Endorses thin white vaginal discharge. Denies malodor or vaginal irritation.   OB History    Gravida Para Term Preterm AB Living   3 1 1   1 1    SAB TAB Ectopic Multiple Live Births   1       1      Past Medical History:  Diagnosis Date  . Ovarian cyst   . PONV (postoperative nausea and vomiting)     Past Surgical History:  Procedure Laterality Date  . DIAGNOSTIC LARYNGOSCOPY  08/08/2016   Procedure: DIAGNOSTIC LARYNGOSCOPY;  Surgeon: Woodroe Mode, MD;  Location: [facility];  Service: Gynecology;;  . LAPAROSCOPIC SALPINGOOPHERECTOMY Right 07/2016   ovarian mass  . OVARIAN CYST REMOVAL  2003    History reviewed. No pertinent family history.  Social History  Substance Use Topics  . Smoking status: Never Smoker  . Smokeless tobacco: Never Used  . Alcohol use No    Allergies: No Known Allergies  No prescriptions prior to admission.    Review of Systems  Constitutional: Negative.   Gastrointestinal: Positive for abdominal pain. Negative for constipation, diarrhea, nausea and vomiting.  Genitourinary: Positive for vaginal discharge. Negative for dysuria and vaginal bleeding.  Musculoskeletal: Positive for back pain.   Physical Exam   Blood pressure 128/77, pulse 73, temperature 98.9 F (37.2 C), temperature source Oral, resp. rate 18, height 5\' 4"  (1.626 m), weight 172 lb (78 kg), last menstrual period 06/29/2017, SpO2 100 %, unknown if currently breastfeeding.  Physical  Exam  Nursing note and vitals reviewed. Constitutional: She is oriented to person, place, and time. She appears well-developed and well-nourished. No distress.  HENT:  Head: Normocephalic and atraumatic.  Eyes: Conjunctivae are normal. Right eye exhibits no discharge. Left eye exhibits no discharge. No scleral icterus.  Neck: Normal range of motion.  Respiratory: Effort normal. No respiratory distress.  GI: Soft. She exhibits no distension. There is no tenderness.  Neurological: She is alert and oriented to person, place, and time.  Skin: Skin is warm and dry. She is not diaphoretic.  Psychiatric: She has a normal mood and affect. Her behavior is normal. Judgment and thought content normal.    MAU Course  Procedures Results for orders placed or performed during the hospital encounter of 08/10/17 (from the past 24 hour(s))  Urinalysis, Routine w reflex microscopic     Status: Abnormal   Collection Time: 08/10/17  6:15 PM  Result Value Ref Range   Color, Urine YELLOW YELLOW   APPearance CLEAR CLEAR   Specific Gravity, Urine 1.008 1.005 - 1.030   pH 5.0 5.0 - 8.0   Glucose, UA NEGATIVE NEGATIVE mg/dL   Hgb urine dipstick SMALL (A) NEGATIVE   Bilirubin Urine NEGATIVE NEGATIVE   Ketones, ur NEGATIVE NEGATIVE mg/dL   Protein, ur NEGATIVE NEGATIVE mg/dL   Nitrite NEGATIVE NEGATIVE   Leukocytes, UA SMALL (A) NEGATIVE   RBC / HPF 0-5 0 - 5 RBC/hpf   WBC, UA 0-5  0 - 5 WBC/hpf   Bacteria, UA RARE (A) NONE SEEN   Squamous Epithelial / LPF 0-5 (A) NONE SEEN   Mucus PRESENT   Pregnancy, urine POC     Status: Abnormal   Collection Time: 08/10/17  6:19 PM  Result Value Ref Range   Preg Test, Ur POSITIVE (A) NEGATIVE  CBC     Status: None   Collection Time: 08/10/17  6:43 PM  Result Value Ref Range   WBC 9.2 4.0 - 10.5 K/uL   RBC 4.37 3.87 - 5.11 MIL/uL   Hemoglobin 12.8 12.0 - 15.0 g/dL   HCT 37.7 36.0 - 46.0 %   MCV 86.3 78.0 - 100.0 fL   MCH 29.3 26.0 - 34.0 pg   MCHC 34.0 30.0 -  36.0 g/dL   RDW 14.1 11.5 - 15.5 %   Platelets 242 150 - 400 K/uL  hCG, quantitative, pregnancy     Status: Abnormal   Collection Time: 08/10/17  6:43 PM  Result Value Ref Range   hCG, Beta Chain, Quant, S 6,586 (H) <5 mIU/mL  Wet prep, genital     Status: Abnormal   Collection Time: 08/10/17  7:00 PM  Result Value Ref Range   Yeast Wet Prep HPF POC NONE SEEN NONE SEEN   Trich, Wet Prep NONE SEEN NONE SEEN   Clue Cells Wet Prep HPF POC NONE SEEN NONE SEEN   WBC, Wet Prep HPF POC FEW (A) NONE SEEN   Sperm NONE SEEN    US Ob Comp Less 14 Wks  Result Date: 08/10/2017 CLINICAL DATA:  Well abdomen and low back pain. Six weeks and 0 days pregnant by last menstrual period. Previous right salpingo-oophorectomy for an ovarian dermoid. EXAM: OBSTETRIC <14 WK [place] AND TRANSVAGINAL OB US TECHNIQUE: Both transabdominal and transvaginal ultrasound examinations were performed for complete evaluation of the gestation as well as the maternal uterus, adnexal regions, and pelvic cul-de-sac. Transvaginal technique was performed to assess early pregnancy. COMPARISON:  Pelvic ultrasound dated 05/12/2016. FINDINGS: Intrauterine gestational sac: Visualized Yolk sac:  Not visualized Embryo:  Not visualized MSD: 6.7  mm   5 w   2  d Subchorionic hemorrhage:  None visualized. Maternal uterus/adnexae: Normal appearing maternal left ovary. The right ovary was not visualized. Trace free peritoneal fluid. IMPRESSION: 1. Intrauterine gestational sac with a size giving an estimated gestational age of [redacted] weeks and 2 days. No yolk sac or fetal pole is seen at this time. Recommend follow-up quantitative B-HCG levels and follow-up US in 14 days to assess viability. This recommendation follows SRU consensus guidelines: Diagnostic Criteria for Nonviable Pregnancy Early in the First Trimester. [employer] Med 2013; 540:0867-61. 2. Surgically absent right ovary. Electronically Signed   By: Claudie Revering M.D.   On: 08/10/2017 19:31   US Ob  Transvaginal  Result Date: 08/10/2017 CLINICAL DATA:  Well abdomen and low back pain. Six weeks and 0 days pregnant by last menstrual period. Previous right salpingo-oophorectomy for an ovarian dermoid. EXAM: OBSTETRIC <14 WK [place] AND TRANSVAGINAL OB US TECHNIQUE: Both transabdominal and transvaginal ultrasound examinations were performed for complete evaluation of the gestation as well as the maternal uterus, adnexal regions, and pelvic cul-de-sac. Transvaginal technique was performed to assess early pregnancy. COMPARISON:  Pelvic ultrasound dated 05/12/2016. FINDINGS: Intrauterine gestational sac: Visualized Yolk sac:  Not visualized Embryo:  Not visualized MSD: 6.7  mm   5 w   2  d Subchorionic hemorrhage:  None visualized. Maternal uterus/adnexae: Normal appearing  maternal left ovary. The right ovary was not visualized. Trace free peritoneal fluid. IMPRESSION: 1. Intrauterine gestational sac with a size giving an estimated gestational age of [redacted] weeks and 2 days. No yolk sac or fetal pole is seen at this time. Recommend follow-up quantitative B-HCG levels and follow-up US in 14 days to assess viability. This recommendation follows SRU consensus guidelines: Diagnostic Criteria for Nonviable Pregnancy Early in the First Trimester. [employer] Med 2013; 161:0960-45. 2. Surgically absent right ovary. Electronically Signed   By: Claudie Revering M.D.   On: 08/10/2017 19:31    MDM +UPT UA, wet prep, GC/chlamydia, CBC, ABO/Rh, quant hCG, HIV, and [place] today to rule out ectopic pregnancy A positive Ultrasound shows IUGS, no yolk sac This abdominal pain could represent a normal pregnancy, spontaneous abortion, or even an ectopic pregnancy which can be life-threatening. Cultures were obtained to rule out pelvic infection.   Assessment and Plan  A:  1. Pregnancy of unknown anatomic location   2. Abdominal pain affecting pregnancy    P: Discharge home Return to MAU Sunday evening for repeat BHCG SAB & ectopic  precautions discussed GC/CT pending  Jorje Guild 08/10/2017, 6:44 PM

## 2017-08-10 NOTE — Discharge Instructions (Signed)
Return to care   If you have heavier bleeding that soaks through more that 2 pads per hour for an hour or more  If you bleed so much that you feel like you might pass out or you do pass out  If you have significant abdominal pain that is not improved with Tylenol   If you develop a fever > 100.5      Abdominal Pain During Pregnancy Abdominal pain is common in pregnancy. Most of the time, it does not cause harm. There are many causes of abdominal pain. Some causes are more serious than others and sometimes the cause is not known. Abdominal pain can be a sign that something is very wrong with the pregnancy or the pain may have nothing to do with the pregnancy. Always tell your health care provider if you have any abdominal pain. Follow these instructions at home:  Do not have sex or put anything in your vagina until your symptoms go away completely.  Watch your abdominal pain for any changes.  Get plenty of rest until your pain improves.  Drink enough fluid to keep your urine clear or pale yellow.  Take over-the-counter or prescription medicines only as told by your health care provider.  Keep all follow-up visits as told by your health care provider. This is important. Contact a health care provider if:  You have a fever.  Your pain gets worse or you have cramping.  Your pain continues after resting. Get help right away if:  You are bleeding, leaking fluid, or passing tissue from the vagina.  You have vomiting or diarrhea that does not go away.  You have painful or bloody urination.  You notice a decrease in your baby's movements.  You feel very weak or faint.  You have shortness of breath.  You develop a severe headache with abdominal pain.  You have abnormal vaginal discharge with abdominal pain. This information is not intended to replace advice given to you by your health care provider. Make sure you discuss any questions you have with your health care  provider. Document Released: 11/27/2005 Document Revised: 09/07/2016 Document Reviewed: 06/26/2013 Elsevier Interactive Patient Education  [employer].

## 2017-08-11 LAB — CULTURE, OB URINE: Culture: <10000 — AB

## 2017-08-12 ENCOUNTER — Inpatient Hospital Stay (HOSPITAL_COMMUNITY)
Admission: AD | Admit: 2017-08-12 | Discharge: 2017-08-12 | Disposition: A | Discharge status: Home / Self Care | Payer: Self-pay | Source: Ambulatory Visit | Attending: Obstetrics & Gynecology | Admitting: Obstetrics & Gynecology

## 2017-08-12 DIAGNOSIS — O3680X0 Pregnancy with inconclusive fetal viability, not applicable or unspecified: Secondary | ICD-10-CM

## 2017-08-12 DIAGNOSIS — Z3490 Encounter for supervision of normal pregnancy, unspecified, unspecified trimester: Secondary | ICD-10-CM | POA: Insufficient documentation

## 2017-08-12 DIAGNOSIS — Z3A01 Less than 8 weeks gestation of pregnancy: Secondary | ICD-10-CM | POA: Insufficient documentation

## 2017-08-12 LAB — HCG, QUANTITATIVE, PREGNANCY: hCG, Beta Chain, Quant, S: 12118 m[IU]/mL — ABNORMAL HIGH (ref <5)

## 2017-08-12 NOTE — MAU Provider Note (Signed)
History   [id number]   Chief Complaint  Patient presents with  . Follow-up    HPI Sonia Johnson is a 36 y.o. female G3P1011 here for follow-up BHCG.  Upon review of the records patient was first seen on 8/31 for abdominal pain. BHCG on that day was 6586.  Ultrasound showed IUGS, no yolk sac. Pt discharged home to f/u for repeat BHCG.  Pt here today with no report of abdominal pain or vaginal bleeding. All other systems negative. This is a desired pregnancy.    Patient's last menstrual period was 06/29/2017.  OB History  Gravida Para Term Preterm AB Living  3 1 1   1 1   SAB TAB Ectopic Multiple Live Births  1       1    # Outcome Date GA Lbr Len/2nd Weight Sex Delivery Anes PTL Lv  3 Current           2 SAB 03/27/16 [redacted]w[redacted]d         1 Term 01/2005 [redacted]w[redacted]d    Vag-Spont         Past Medical History:  Diagnosis Date  . Ovarian cyst   . PONV (postoperative nausea and vomiting)     No family history on file.  Social History   Social History  . Marital status: Single    Spouse name: N/A  . Number of children: N/A  . Years of education: N/A   Social History Main Topics  . Smoking status: Never Smoker  . Smokeless tobacco: Never Used  . Alcohol use No  . Drug use: No  . Sexual activity: Yes    Birth control/ protection: None   Other Topics Concern  . Not on file   Social History Narrative  . No narrative on file    No Known Allergies  No current facility-administered medications on file prior to encounter.    Current Outpatient Prescriptions on File Prior to Encounter  Medication Sig Dispense Refill  . Prenatal Vit-Fe Fumarate-FA (PRENATAL MULTIVITAMIN) TABS tablet Take 2 tablets by mouth daily at 12 noon.       Physical Exam   Vitals:   08/12/17 1741  BP: 113/68  Pulse: 88  Resp: 16  Temp: 98.2 F (36.8 C)    Physical Exam  Nursing note and vitals reviewed. Constitutional: She is oriented to person, place, and time. She appears well-developed and  well-nourished. No distress.  HENT:  Head: Normocephalic and atraumatic.  Eyes: Conjunctivae are normal. Right eye exhibits no discharge. Left eye exhibits no discharge. No scleral icterus.  Cardiovascular:  No murmur heard. Respiratory: Effort normal. No respiratory distress.  Neurological: She is alert and oriented to person, place, and time.  Skin: Skin is warm and dry. She is not diaphoretic.  Psychiatric: She has a normal mood and affect. Her behavior is normal. Judgment and thought content normal.    MAU Course  Procedures Results for orders placed or performed during the hospital encounter of 08/12/17 (from the past 24 hour(s))  hCG, quantitative, pregnancy     Status: Abnormal   Collection Time: 08/12/17  5:42 PM  Result Value Ref Range   hCG, Beta Chain, Quant, S 12,118 (H) <5 mIU/mL    MDM Appropriate rise in BHCG & pt asymptomatic since previous visit. Will order outpatient ultrasound for viability.  Assessment and Plan  36 y.o. G3P1011 at [redacted]w[redacted]d wks Pregnancy Follow-up BHCG Pregnancy of Unknown Location  P: Discharge home F/u ultrasound for viability ordered Discussed reasons  to return to MAU  Jorje Guild, NP 08/12/2017 7:04 PM

## 2017-08-12 NOTE — Discharge Instructions (Signed)
Return to care  °· If you have heavier bleeding that soaks through more that 2 pads per hour for an hour or more °· If you bleed so much that you feel like you might pass out or you do pass out °· If you have significant abdominal pain that is not improved with Tylenol  °· If you develop a fever > 100.5 ° °

## 2017-08-12 NOTE — MAU Note (Signed)
Patient returns for repeat HCG, N/V, no vaginal bleeding, had mild abdominal pain yesterday, none today.

## 2017-08-16 LAB — GC/CHLAMYDIA PROBE AMP (~~LOC~~) NOT AT ARMC
CHLAMYDIA, DNA PROBE: NEGATIVE
Neisseria Gonorrhea: NEGATIVE

## 2017-08-22 ENCOUNTER — Ambulatory Visit (HOSPITAL_COMMUNITY)
Admission: RE | Admit: 2017-08-22 | Discharge: 2017-08-22 | Disposition: A | Discharge status: Home / Self Care | Payer: Self-pay | Source: Ambulatory Visit | Attending: Student | Admitting: Student

## 2017-08-22 ENCOUNTER — Ambulatory Visit: Payer: Self-pay

## 2017-08-22 DIAGNOSIS — O3680X0 Pregnancy with inconclusive fetal viability, not applicable or unspecified: Secondary | ICD-10-CM

## 2017-08-22 DIAGNOSIS — Z3A01 Less than 8 weeks gestation of pregnancy: Secondary | ICD-10-CM | POA: Insufficient documentation

## 2017-08-22 DIAGNOSIS — Z712 Person consulting for explanation of examination or test findings: Secondary | ICD-10-CM

## 2017-08-22 DIAGNOSIS — Z3491 Encounter for supervision of normal pregnancy, unspecified, first trimester: Secondary | ICD-10-CM | POA: Insufficient documentation

## 2017-08-22 NOTE — Progress Notes (Signed)
Patient presented to the office today for viability results. Results does confirms a viable pregnancy at this time. Patient has been advised to start prenatal vitamins at this time. Patient reports having a new ob appointment with the [facility].

## 2017-08-22 NOTE — Progress Notes (Signed)
Chart reviewed for nurse visit. Agree with plan of care.   Luiz Blare, DO 08/22/2017, 9:42 AM

## 2017-08-31 ENCOUNTER — Telehealth (HOSPITAL_COMMUNITY): Payer: Self-pay | Admitting: *Deleted

## 2018-01-04 IMAGING — US US OB TRANSVAGINAL
1 series · 15 of 28 positions shown · non-contrast
Comparison: 08/10/2017 obstetric scan.

CLINICAL DATA: 36-year-old pregnant female presents for assessment
of fetal viability. Quantitative beta HCG 12,118 on 08/12/2017.

EDC by LMP: 04/05/2018, projecting to an expected gestational age of
7 weeks 5 days.
EXAM:
TRANSVAGINAL OB ULTRASOUND
TECHNIQUE: Transvaginal ultrasound was performed for complete evaluation of the
gestation as well as the maternal uterus, adnexal regions, and
pelvic cul-de-sac.

[Series 1: us ob transvaginal · 15 of 38 slices shown]
[im 1/38]
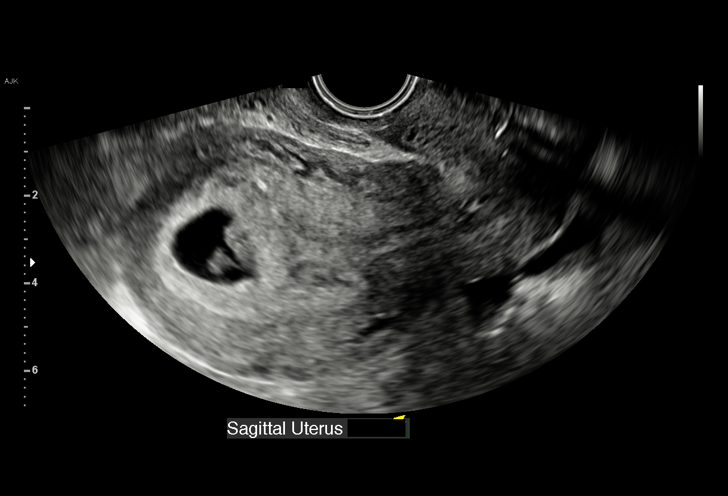
[im 3/38]
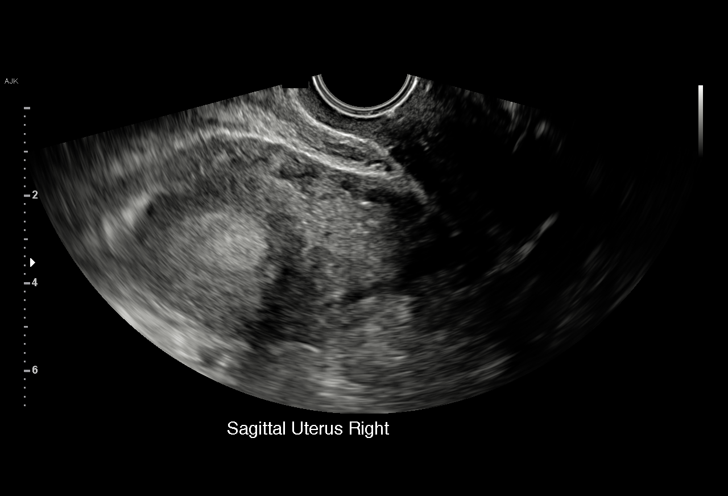
[im 6/38]
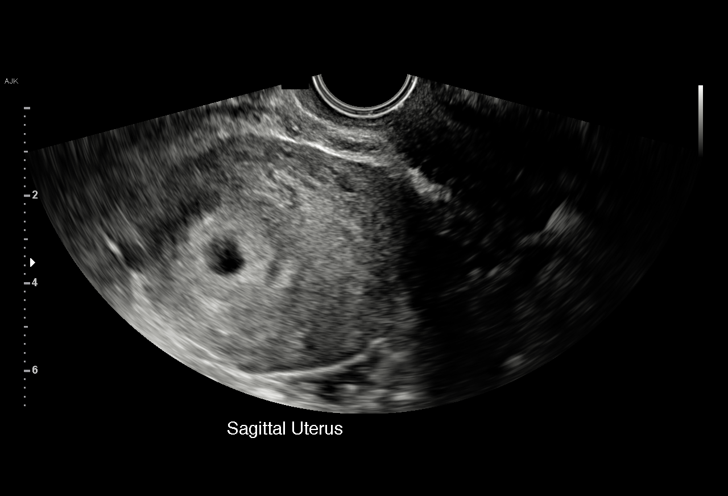
[im 9/38]
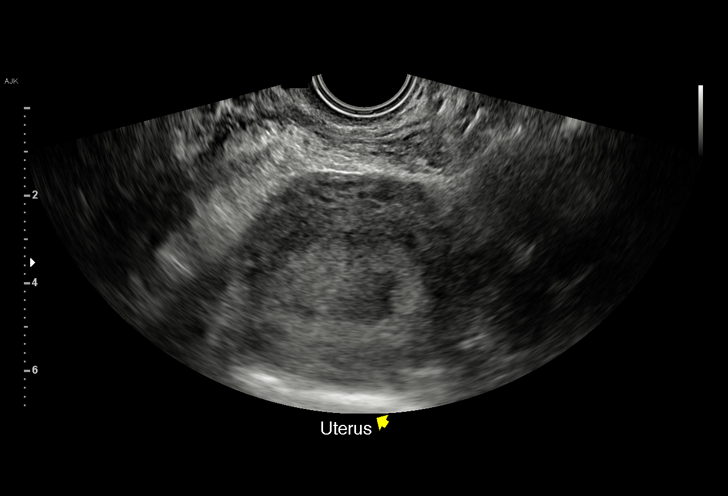
[im 11/38]
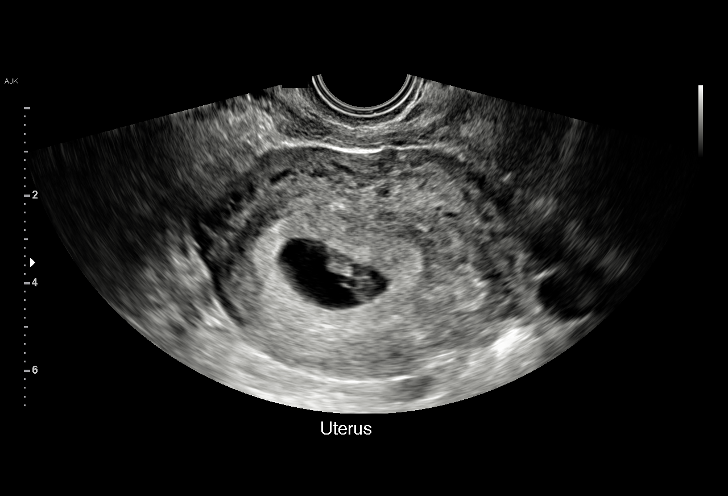
[im 14/38]
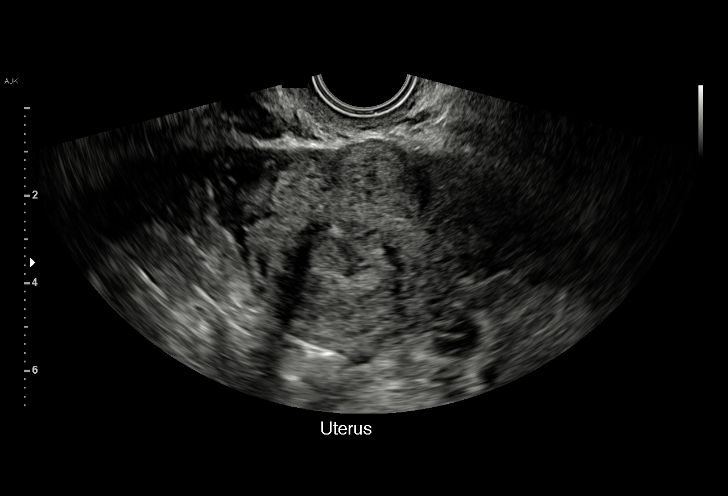
[im 17/38]
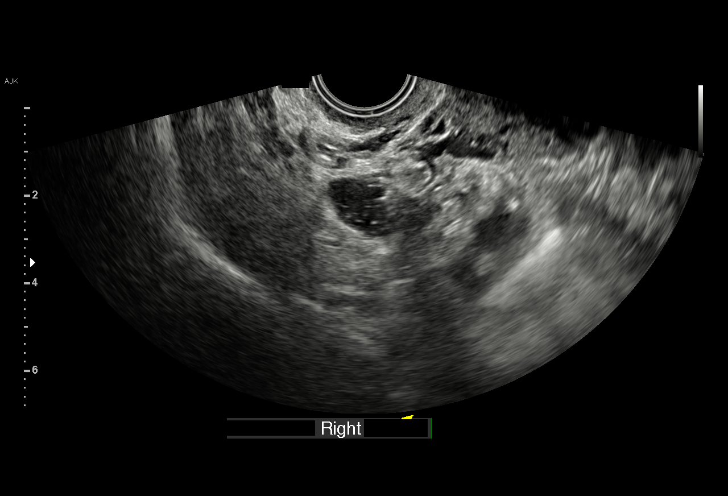
[im 20/38]
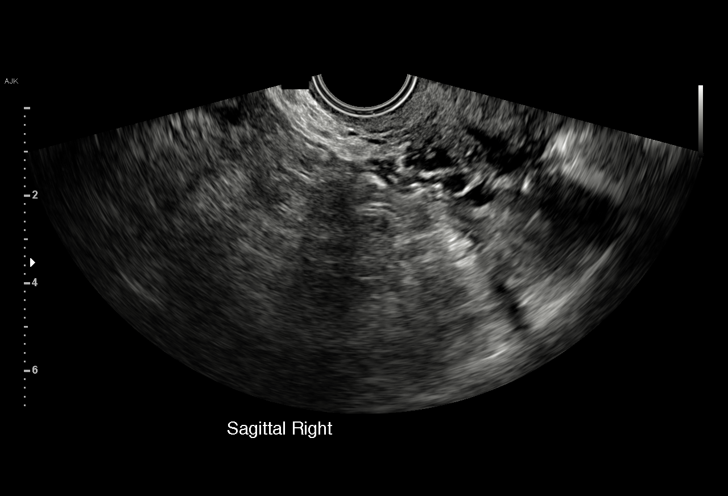
[im 21/38]
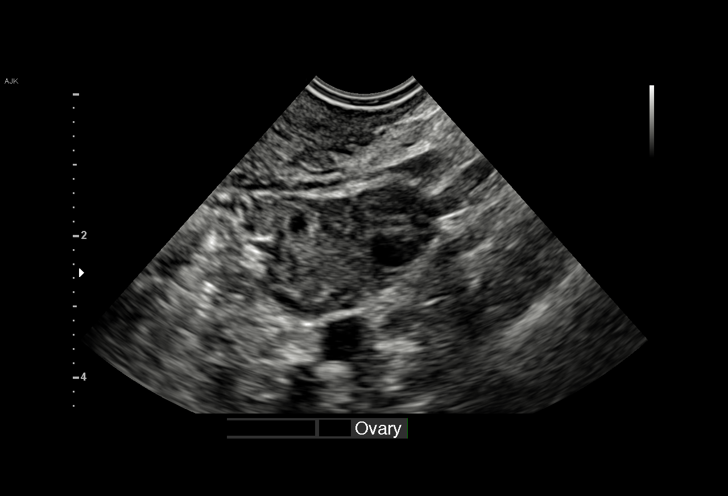
[im 24/38]
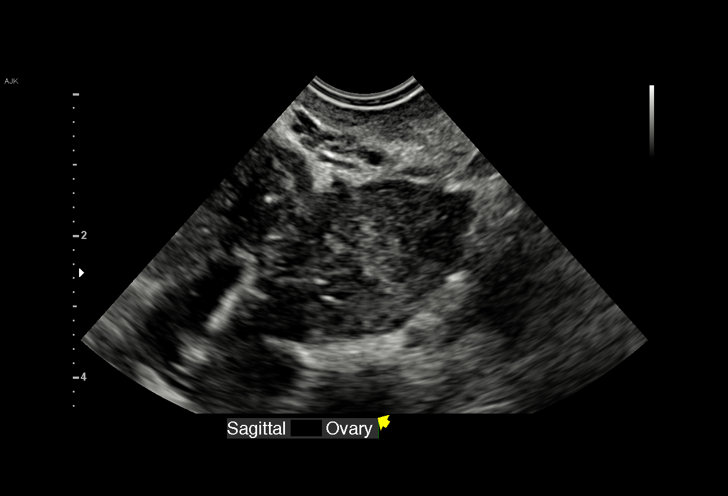
[im 27/38]
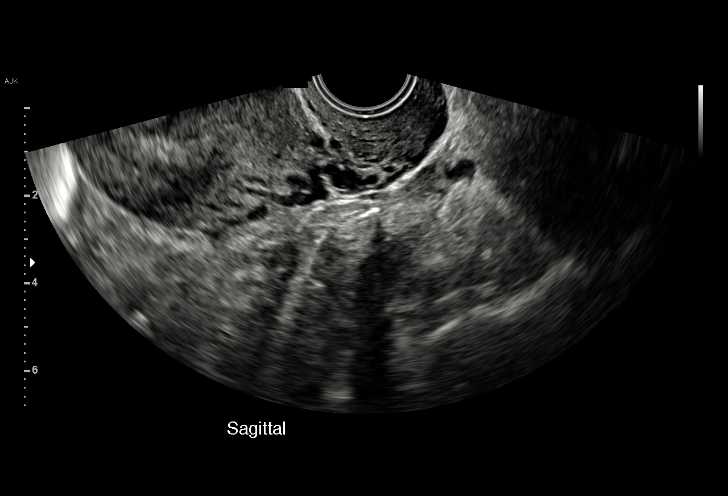
[im 29/38]
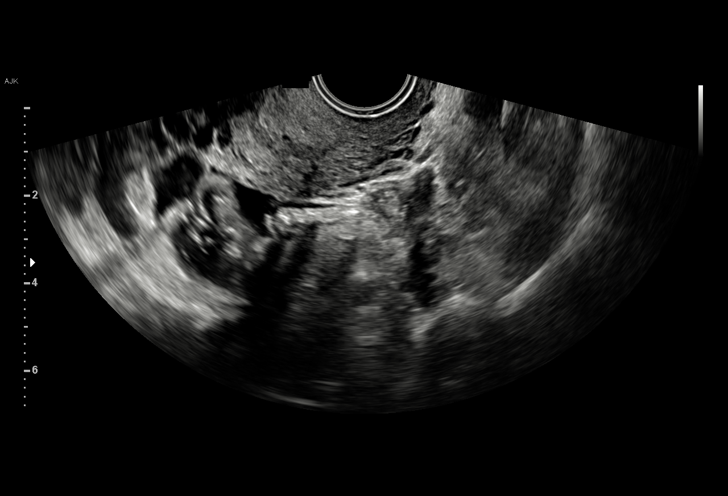
[im 32/38]
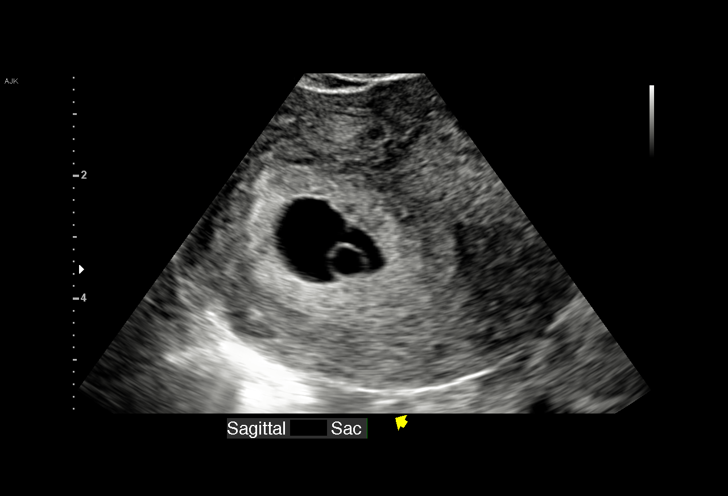
[im 35/38]
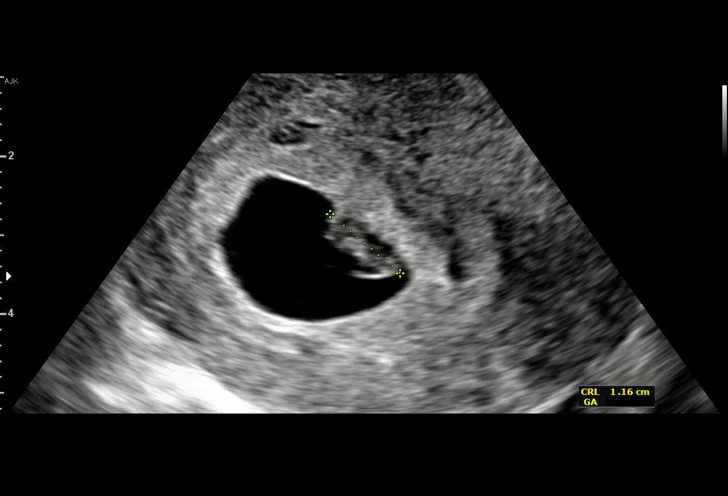
[im 38/38]
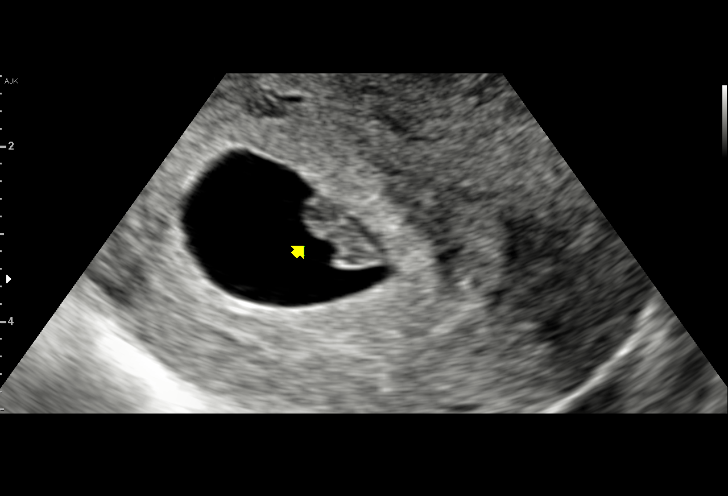

[15 of 28 positions shown; findings below may reference images not displayed]

FINDINGS: Intrauterine gestational sac: Single intrauterine gestational sac
appears normal in size, shape and position.

Yolk sac:  Visualized.

Embryo:  Visualized.

Embryonic Cardiac Activity: Regular rate and rhythm.

Embryonic Heart Rate: 144 bpm

CRL:   10.9  mm   7 w 1 d                  US EDC: 04/09/2018

Subchorionic hemorrhage:  None visualized.

Maternal uterus/adnexae: Anteverted uterus. No uterine fibroids.
Left ovary measures 3.0 x 2.0 x 2.3 cm. Surgically absent right
ovary. No adnexal masses. No abnormal free fluid in the pelvis.
IMPRESSION: 1. Single living intrauterine gestation at 7 weeks 1 day by
crown-rump length, concordant with provided menstrual dating.
2. No acute first-trimester gestational abnormality.
3. No adnexal masses .

## 2018-06-28 ENCOUNTER — Encounter (HOSPITAL_COMMUNITY): Payer: Self-pay
